# Patient Record
Sex: Female | Born: 1955 | Race: White | Hispanic: No | Marital: Married | Smoking: Never smoker
Health system: Southern US, Community
[De-identification: ages and names within clinical notes are randomized; demographics above are authoritative.]

## PROBLEM LIST (undated history)

## (undated) DIAGNOSIS — M1990 Unspecified osteoarthritis, unspecified site: Secondary | ICD-10-CM

## (undated) DIAGNOSIS — I10 Essential (primary) hypertension: Secondary | ICD-10-CM

## (undated) DIAGNOSIS — R7303 Prediabetes: Secondary | ICD-10-CM

## (undated) DIAGNOSIS — R112 Nausea with vomiting, unspecified: Secondary | ICD-10-CM

## (undated) DIAGNOSIS — Z9889 Other specified postprocedural states: Secondary | ICD-10-CM

## (undated) DIAGNOSIS — Z87442 Personal history of urinary calculi: Secondary | ICD-10-CM

## (undated) DIAGNOSIS — J189 Pneumonia, unspecified organism: Secondary | ICD-10-CM

## (undated) DIAGNOSIS — K219 Gastro-esophageal reflux disease without esophagitis: Secondary | ICD-10-CM

## (undated) DIAGNOSIS — E049 Nontoxic goiter, unspecified: Secondary | ICD-10-CM

## (undated) HISTORY — PX: OVARIAN CYST REMOVAL: SHX89

## (undated) HISTORY — PX: LITHOTRIPSY: SUR834

## (undated) HISTORY — PX: CHOLECYSTECTOMY: SHX55

## (undated) HISTORY — PX: TUBAL LIGATION: SHX77

## (undated) HISTORY — PX: COLONOSCOPY: SHX174

---

## 2016-12-12 ENCOUNTER — Other Ambulatory Visit (HOSPITAL_COMMUNITY): Payer: Self-pay | Admitting: Orthopedic Surgery

## 2016-12-12 DIAGNOSIS — M5416 Radiculopathy, lumbar region: Secondary | ICD-10-CM

## 2016-12-24 ENCOUNTER — Ambulatory Visit (HOSPITAL_COMMUNITY)
Admission: RE | Admit: 2016-12-24 | Discharge: 2016-12-24 | Disposition: A | Discharge status: Home / Self Care | Payer: Worker's Compensation | Source: Ambulatory Visit | Attending: Orthopedic Surgery | Admitting: Orthopedic Surgery

## 2016-12-24 DIAGNOSIS — M2578 Osteophyte, vertebrae: Secondary | ICD-10-CM | POA: Insufficient documentation

## 2016-12-24 DIAGNOSIS — M48061 Spinal stenosis, lumbar region without neurogenic claudication: Secondary | ICD-10-CM | POA: Insufficient documentation

## 2016-12-24 DIAGNOSIS — N200 Calculus of kidney: Secondary | ICD-10-CM | POA: Insufficient documentation

## 2016-12-24 DIAGNOSIS — M5126 Other intervertebral disc displacement, lumbar region: Secondary | ICD-10-CM | POA: Insufficient documentation

## 2016-12-24 DIAGNOSIS — M545 Low back pain: Secondary | ICD-10-CM | POA: Diagnosis present

## 2016-12-24 DIAGNOSIS — M5416 Radiculopathy, lumbar region: Secondary | ICD-10-CM

## 2017-01-25 ENCOUNTER — Encounter (HOSPITAL_COMMUNITY): Payer: Self-pay | Admitting: *Deleted

## 2017-01-25 NOTE — Progress Notes (Signed)
Spoke with pt for pre-op call. Pt denies cardiac history, chest pain or sob. Pt states she is not diabetic. Medication list is incomplete, pt states she's not home and doesn't know dosages. She states she is on an antibiotic now for an UTI, takes Valsartan for her BP and is on an 81 mg Aspirin.

## 2017-01-29 ENCOUNTER — Ambulatory Visit (HOSPITAL_COMMUNITY): Payer: Worker's Compensation | Admitting: Certified Registered Nurse Anesthetist

## 2017-01-29 ENCOUNTER — Encounter (HOSPITAL_COMMUNITY)
Admission: RE | Disposition: A | Discharge status: Home / Self Care | Payer: Self-pay | Source: Ambulatory Visit | Attending: Orthopedic Surgery

## 2017-01-29 ENCOUNTER — Ambulatory Visit (HOSPITAL_COMMUNITY)
Admission: RE | Admit: 2017-01-29 | Discharge: 2017-01-29 | Disposition: A | Discharge status: Home / Self Care | Payer: Worker's Compensation | Source: Ambulatory Visit | Attending: Orthopedic Surgery | Admitting: Orthopedic Surgery

## 2017-01-29 ENCOUNTER — Encounter (HOSPITAL_COMMUNITY): Payer: Self-pay | Admitting: Urology

## 2017-01-29 DIAGNOSIS — I10 Essential (primary) hypertension: Secondary | ICD-10-CM | POA: Insufficient documentation

## 2017-01-29 DIAGNOSIS — M5126 Other intervertebral disc displacement, lumbar region: Secondary | ICD-10-CM | POA: Insufficient documentation

## 2017-01-29 DIAGNOSIS — M5127 Other intervertebral disc displacement, lumbosacral region: Secondary | ICD-10-CM | POA: Diagnosis not present

## 2017-01-29 DIAGNOSIS — M48061 Spinal stenosis, lumbar region without neurogenic claudication: Secondary | ICD-10-CM | POA: Diagnosis not present

## 2017-01-29 DIAGNOSIS — K219 Gastro-esophageal reflux disease without esophagitis: Secondary | ICD-10-CM | POA: Diagnosis not present

## 2017-01-29 DIAGNOSIS — M1990 Unspecified osteoarthritis, unspecified site: Secondary | ICD-10-CM | POA: Insufficient documentation

## 2017-01-29 DIAGNOSIS — Z79899 Other long term (current) drug therapy: Secondary | ICD-10-CM | POA: Insufficient documentation

## 2017-01-29 DIAGNOSIS — Z6836 Body mass index (BMI) 36.0-36.9, adult: Secondary | ICD-10-CM | POA: Diagnosis not present

## 2017-01-29 DIAGNOSIS — N200 Calculus of kidney: Secondary | ICD-10-CM | POA: Insufficient documentation

## 2017-01-29 DIAGNOSIS — Z7982 Long term (current) use of aspirin: Secondary | ICD-10-CM | POA: Insufficient documentation

## 2017-01-29 DIAGNOSIS — M545 Low back pain: Secondary | ICD-10-CM | POA: Diagnosis present

## 2017-01-29 DIAGNOSIS — M5416 Radiculopathy, lumbar region: Secondary | ICD-10-CM

## 2017-01-29 DIAGNOSIS — M4326 Fusion of spine, lumbar region: Secondary | ICD-10-CM | POA: Diagnosis not present

## 2017-01-29 HISTORY — DX: Essential (primary) hypertension: I10

## 2017-01-29 HISTORY — DX: Gastro-esophageal reflux disease without esophagitis: K21.9

## 2017-01-29 HISTORY — DX: Other specified postprocedural states: R11.2

## 2017-01-29 HISTORY — DX: Personal history of urinary calculi: Z87.442

## 2017-01-29 HISTORY — DX: Unspecified osteoarthritis, unspecified site: M19.90

## 2017-01-29 HISTORY — DX: Pneumonia, unspecified organism: J18.9

## 2017-01-29 HISTORY — PX: RADIOLOGY WITH ANESTHESIA: SHX6223

## 2017-01-29 HISTORY — DX: Nontoxic goiter, unspecified: E04.9

## 2017-01-29 HISTORY — DX: Other specified postprocedural states: Z98.890

## 2017-01-29 LAB — BASIC METABOLIC PANEL
ANION GAP: 8 (ref 5–15)
BUN: 19 mg/dL (ref 6–20)
CO2: 23 mmol/L (ref 22–32)
Calcium: 9 mg/dL (ref 8.9–10.3)
Chloride: 111 mmol/L (ref 101–111)
Creatinine, Ser: 1.09 mg/dL — ABNORMAL HIGH (ref 0.44–1.00)
GFR calc Af Amer: >60 mL/min (ref >60)
GFR, EST NON AFRICAN AMERICAN: 54 mL/min — AB (ref >60)
Glucose, Bld: 118 mg/dL — ABNORMAL HIGH (ref 65–99)
POTASSIUM: 3.8 mmol/L (ref 3.5–5.1)
SODIUM: 142 mmol/L (ref 135–145)

## 2017-01-29 SURGERY — RADIOLOGY WITH ANESTHESIA
Anesthesia: General

## 2017-01-29 MED ORDER — GADOBENATE DIMEGLUMINE 529 MG/ML IV SOLN
20.0000 mL | Freq: Once | INTRAVENOUS | Status: AC
  Administered 2017-01-29: 17 mL via INTRAVENOUS

## 2017-01-29 MED ORDER — MIDAZOLAM HCL 2 MG/2ML IJ SOLN
INTRAMUSCULAR | Status: DC | PRN
  Administered 2017-01-29: 2 mg via INTRAVENOUS

## 2017-01-29 MED ORDER — FENTANYL CITRATE (PF) 100 MCG/2ML IJ SOLN
INTRAMUSCULAR | Status: DC | PRN
  Administered 2017-01-29: 100 ug via INTRAVENOUS

## 2017-01-29 MED ORDER — LACTATED RINGERS IV SOLN
INTRAVENOUS | Status: DC
  Administered 2017-01-29: 07:00:00 via INTRAVENOUS

## 2017-01-29 NOTE — Transfer of Care (Signed)
Immediate Anesthesia Transfer of Care Note  Patient: Heidi Brown CourseSharon Vaccarella  Procedure(s) Performed: Procedure(s): LUMBER MRI WITH AND WITHOUT CONTRAST (N/A)  Patient Location: PACU  Anesthesia Type:General  Level of Consciousness: awake, alert , oriented and patient cooperative  Airway & Oxygen Therapy: Patient Spontanous Breathing and Patient connected to nasal cannula oxygen  Post-op Assessment: Report given to RN, Post -op Vital signs reviewed and stable and Patient moving all extremities X 4  Post vital signs: Reviewed and stable  Last Vitals:  Vitals:   01/29/17 0950 01/29/17 0955  BP: (!) 156/89 (!) 145/79  Pulse: 79 77  Resp: (!) 6 (!) 0  Temp:    SpO2: 94% 91%    Last Pain:  Vitals:   01/29/17 0707  TempSrc:   PainSc: 6       Patients Stated Pain Goal: 6 (01/29/17 0707)  Complications: No apparent anesthesia complications

## 2017-01-29 NOTE — Anesthesia Preprocedure Evaluation (Signed)
Anesthesia Evaluation  Patient identified by MRN, date of birth, ID band Patient awake    Reviewed: Allergy & Precautions, NPO status , Patient's Chart, lab work & pertinent test results  History of Anesthesia Complications (+) PONV and history of anesthetic complications  Airway Mallampati: II  TM Distance: >3 FB Neck ROM: Full    Dental  (+) Dental Advisory Given   Pulmonary neg pulmonary ROS,    breath sounds clear to auscultation       Cardiovascular hypertension, Pt. on medications  Rhythm:Regular     Neuro/Psych  Neuromuscular disease negative psych ROS   GI/Hepatic Neg liver ROS, GERD  Controlled,  Endo/Other  Morbid obesity  Renal/GU negative Renal ROS     Musculoskeletal  (+) Arthritis ,   Abdominal   Peds  Hematology negative hematology ROS (+)   Anesthesia Other Findings   Reproductive/Obstetrics                             Anesthesia Physical Anesthesia Plan  ASA: II  Anesthesia Plan: General   Post-op Pain Management:    Induction: Intravenous  PONV Risk Score and Plan: 4 or greater and Ondansetron and Dexamethasone  Airway Management Planned: Oral ETT  Additional Equipment: None  Intra-op Plan:   Post-operative Plan: Extubation in OR  Informed Consent: I have reviewed the patients History and Physical, chart, labs and discussed the procedure including the risks, benefits and alternatives for the proposed anesthesia with the patient or authorized representative who has indicated his/her understanding and acceptance.   Dental advisory given  Plan Discussed with: CRNA and Surgeon  Anesthesia Plan Comments:         Anesthesia Quick Evaluation

## 2017-01-29 NOTE — Anesthesia Postprocedure Evaluation (Signed)
Anesthesia Post Note  Patient: Philomena CourseSharon Duer  Procedure(s) Performed: Procedure(s) (LRB): LUMBER MRI WITH AND WITHOUT CONTRAST (N/A)     Patient location during evaluation: PACU Anesthesia Type: General Level of consciousness: awake and alert Pain management: pain level controlled Vital Signs Assessment: post-procedure vital signs reviewed and stable Respiratory status: spontaneous breathing, nonlabored ventilation, respiratory function stable and patient connected to nasal cannula oxygen Cardiovascular status: blood pressure returned to baseline and stable Postop Assessment: no signs of nausea or vomiting Anesthetic complications: no    Last Vitals:  Vitals:   01/29/17 0955 01/29/17 1006  BP: (!) 145/79 (!) 160/84  Pulse: 77 73  Resp: 16 14  Temp:  37.1 C  SpO2: 91% 94%    Last Pain:  Vitals:   01/29/17 0707  TempSrc:   PainSc: 6                  Maryalyce Sanjuan

## 2017-01-30 ENCOUNTER — Encounter (HOSPITAL_COMMUNITY): Payer: Self-pay | Admitting: Radiology

## 2017-02-04 MED FILL — Dexamethasone Sodium Phosphate Inj 10 MG/ML: INTRAMUSCULAR | Qty: 1 | Status: AC

## 2017-02-04 MED FILL — Rocuronium Bromide IV Soln 50 MG/5ML (10 MG/ML): INTRAVENOUS | Qty: 5 | Status: AC

## 2017-02-04 MED FILL — Midazolam HCl Inj 2 MG/2ML (Base Equivalent): INTRAMUSCULAR | Qty: 2 | Status: AC

## 2017-02-04 MED FILL — Propofol IV Emul 200 MG/20ML (10 MG/ML): INTRAVENOUS | Qty: 20 | Status: AC

## 2017-02-04 MED FILL — Sugammadex Sodium IV 200 MG/2ML (Base Equivalent): INTRAVENOUS | Qty: 2 | Status: AC

## 2017-02-04 MED FILL — Ondansetron HCl Inj 4 MG/2ML (2 MG/ML): INTRAMUSCULAR | Qty: 2 | Status: AC

## 2017-02-04 MED FILL — Lactated Ringer's Solution: INTRAVENOUS | Qty: 1000 | Status: AC

## 2017-02-04 MED FILL — Fentanyl Citrate Preservative Free (PF) Inj 100 MCG/2ML: INTRAMUSCULAR | Qty: 2 | Status: AC

## 2017-02-04 MED FILL — Phenylephrine-NaCl IV Solution 10 MG/250ML-0.9%: INTRAVENOUS | Qty: 250 | Status: AC

## 2018-01-30 ENCOUNTER — Ambulatory Visit: Payer: Self-pay | Admitting: Orthopedic Surgery

## 2018-02-04 ENCOUNTER — Ambulatory Visit: Payer: Self-pay | Admitting: Orthopedic Surgery

## 2018-02-04 NOTE — H&P (View-Only) (Signed)
Heidi Brown is an 61 y.o. female.   Chief Complaint: back and leg pain HPI: Reason for Visit: follow up IME Context: work injury (DOI 07/01/2016); The patient is 1 year, 2 1/2 months out from the injury Location (Lower Extremity): lower back pain ; leg pain bilateral, , (comes and goes) Severity: pain level 5/10 Aggravating Factors: standing or sitting for extended periods of time Alleviating Factors: changing positions Are you working? The patient is currently working full duties. Medications: The patient is currently taking ibuprofen and Tylenol prn Patient is better when to set worse when she stands. She has had an independent medical exam by Dr. Cohen. I have reviewed that.  Past Medical History:  Diagnosis Date  . Arthritis   . GERD (gastroesophageal reflux disease)   . Goiter   . History of kidney stones   . Hypertension   . Pneumonia    walking pneumonia  . PONV (postoperative nausea and vomiting)     Past Surgical History:  Procedure Laterality Date  . CESAREAN SECTION    . CHOLECYSTECTOMY    . COLONOSCOPY    . LITHOTRIPSY Bilateral   . OVARIAN CYST REMOVAL    . OVARIAN CYST REMOVAL    . RADIOLOGY WITH ANESTHESIA N/A 01/29/2017   Procedure: LUMBER MRI WITH AND WITHOUT CONTRAST;  Surgeon: Radiologist, Medication, MD;  Location: [facility] OR;  Service: Radiology;  Laterality: N/A;  . TUBAL LIGATION      Family History  Problem Relation Age of Onset  . Alzheimer's disease Mother   . Heart disease Mother   . Heart disease Father   . Cancer Father   . Hypertension Father   . COPD Father   . Sleep apnea Father    Social History:  reports that she has never smoked. She has never used smokeless tobacco. She reports that she drinks alcohol. She reports that she does not use drugs.  Allergies:  Allergies  Allergen Reactions  . Erythromycin Itching, Rash and Other (See Comments)    Hives, itchy  Clindamycin  Medications: ibuprofen Tylenol valsartan 320  mg-hydrochlorothiazide 25 mg tablet  Review of Systems  Constitutional: Negative.   HENT: Negative.   Eyes: Negative.   Respiratory: Negative.   Cardiovascular: Negative.   Gastrointestinal: Negative.   Genitourinary: Negative.   Musculoskeletal: Positive for back pain.  Skin: Negative.   Neurological: Positive for sensory change and focal weakness.  Psychiatric/Behavioral: Negative.     There were no vitals taken for this visit. Physical Exam  Constitutional: She is oriented to person, place, and time. She appears well-developed and well-nourished. She appears distressed.  HENT:  Head: Normocephalic.  Eyes: Pupils are equal, round, and reactive to light.  Neck: Normal range of motion. Neck supple.  Cardiovascular: Normal rate.  Respiratory: Effort normal.  GI: Soft.  Musculoskeletal:  Patient is a 61-year-old female.  Constitutional: General Appearance: healthy-appearing and distress (mild).  Psychiatric: Mood and Affect: active and alert and normal affect.  Cardiovascular System: Edema Right: none; Dorsalis and posterior tibial pulses 2+. Edema Left: none.  Cervical Spine: Inspection: alignment normal and no muscle atrophy. Bony Palpation: no tenderness of the spinous process. Active Range of Motion: pain elicited by motion.  Motor Strength: Neck Strength (Intrinsics): extension 5/5. C5 on the Right: abduction deltoid 5/5. C5 on the Left: abduction deltoid 5/5. C6 on the Right: flexion biceps 5/5. C6 on the Left: flexion biceps 5/5. C7 on the Right: extension triceps 5/5 and flexion wrist 5/5. C7 on the Left:   extension triceps 5/5 and flexion wrist 5/5. C8 on the Right: flexion fingers 5/5. C8 on the Left: flexion fingers 5/5. T1 on the Right: abduction fingers 5/5. T1 on the Left: abduction fingers 5/5.  Neurological System: Biceps Reflex Right: normal (2). Biceps Reflex Left: normal on the left (2). Brachioradialis Reflex Right: normal (2). Brachioradialis Reflex Left:  normal (2). Triceps Reflex Right: normal (2). Triceps Reflex Left: normal (2). Sensation on the Right: normal median nerve distribution and ulnar nerve distribution and C5 normal, C6 normal, C7 normal, C8 normal, T1 normal, and sensation of the distal extremities normal. Sensation on the Left: normal median nerve distribution and ulnar nerve distribution and C5 normal, C6 normal, C7 normal, T1 normal, and distal extremities normal. Special Tests on the Right: Spurling's test negative. Special Tests on the Left: Spurling's test negative. Special Tests: Hoffman Sign negative. No Babinski or Clonus. Knee Reflex Right: normal (2). Knee Reflex Left: normal (2). Ankle Reflex Right: normal (2). Ankle Reflex Left: normal (2). Babinski Reflex Right: plantar reflex absent. Babinski Reflex Left: plantar reflex absent. Special Tests on the Right: no clonus of the ankle/knee and seated straight leg raising test positive. Special Tests on the Left: no clonus of the ankle/knee and seated straight leg raising test positive.  Skin: Head and Neck: normal. Right Upper Extremity: normal. Left Upper Extremity: normal. Inspection and palpation: no rash.  Gait and Station: Appearance: ambulating with no assistive devices and antalgic gait.  Abdomen: Inspection and Palpation: non-distended and no tenderness.  Lumbar Spine: Inspection: normal alignment. Bony Palpation of the Lumbar Spine: tender at lumbosacral junction.. Bony Palpation of the Right Hip: no tenderness of the greater trochanter and tenderness of the SI joint; Pelvis stable. Bony Palpation of the Left Hip: no tenderness of the greater trochanter and tenderness of the SI joint. Soft Tissue Palpation on the Right: No flank pain with percussion. Active Range of Motion: limited flexion and extention.  Motor Strength: L1 Motor Strength on the Right: hip flexion iliopsoas 5/5. L1 Motor Strength on the Left: hip flexion iliopsoas 5/5. L2-L4 Motor Strength on the Right: knee  extension quadriceps 5/5. L2-L4 Motor Strength on the Left: knee extension quadriceps 5/5. L5 Motor Strength on the Right: ankle dorsiflexion tibialis anterior 5/5 and great toe extension extensor hallucis longus 4/5. L5 Motor Strength on the Left: ankle dorsiflexion tibialis anterior 5/5 and great toe extension extensor hallucis longus 4/5. S1 Motor Strength on the Right: plantar flexion gastrocnemius 5/5. S1 Motor Strength on the Left: plantar flexion gastrocnemius 5/5.  Some discomfort is noted in her cervical spine but neurologically intact in the upper extremities. No Hoffmann sign no Babinski or clonus.  Neurological: She is alert and oriented to person, place, and time.  Skin: Skin is warm and dry.    Review of her x-rays demonstrates autofusion at L4-5. Multilevel anterior osteophytic spurring consistent with skeletal hyperostosis.  MRI demonstrates severe multifactorial spinal stenosis particularly at L4-5 and L3-4 secondary to a ossified disc herniation noted on her CT scan. There is also lateral recess stenosis noted at L5-S1. Autofusion of her facet joints.  Assessment/Plan 1. Pain in lumbar spine M54.5: Low back pain 2. Spinal stenosis of lumbar region M48.061: Spinal stenosis, lumbar region without neurogenic claudication 3. 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 Increased body mass index Z68.41: Body mass index (BMI) 40.0-44.9, adult 6. 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 Patient remained symptomatic following a work-related injury. She had no problems prior to this injury and has had specific ambulatory pain since. That is typical with underlying severe spinal stenosis. She does  have mild of EHL weakness bilaterally. She has to sit for relief.  As previously discussed we discussed living with her symptoms avoiding prolonged standing. Utilization of sitting and leaning forward. As well as periodic at analgesics. I discussed her independent medical exam with Dr. Cohen.  She is continued to indicate that this is no longer acceptable to her. Her husband is present with her as well indicates at home she is in continued pain.  We previously discussed a lumbar decompression I particularly at L4-5. And L3-4.  She does have lateral recess stenosis at L5-S1 to some degree. During the decompression which would require removal of the neural arch of L4 and L5 decompression of L3-4 and L5-S1 would be appropriate.  I had an extensive discussion with the patient concerning the pathology relevant anatomy and treatment options. At this point exhausting conservative treatment and in the presence of a neurologic deficit we discussed microlumbar decompression. I discussed the risks and benefits including bleeding, infection, DVT, PE, anesthetic complications, worsening in their symptoms, improvement in their symptoms, C SF leakage, epidural fibrosis, need for future surgeries such as revision discectomy and lumbar fusion. I also indicated that this is an operation to basically decompress the nerve root to allow recovery as opposed to fixing a herniated disc and that the incidence of recurrent chest disc herniation can approach 15%. Also that nerve root recovery is variable and may not recover completely. She has a calcified disc at L3-4. That would likely remain. The decompression will occur posteriorly. She does have an elevated BMI. No need for a concomitant fusion as she is autofused at those levels.  I discussed the operative course including overnight in the hospital. Immediate ambulation. Follow-up in 2 weeks for suture removal. 6 weeks until healing of the herniation followed by 6 weeks of  reconditioning and strengthening of the core musculature. Also discussed the need to employ the concepts of disc pressure management and core motion following the surgery to minimize the risk of recurrent disc herniation. We will obtain preoperative clearance i if necessary and proceed accordingly.  Discussed these  No history of DVT or MRSA exposure. Obtain a preoperative clearance. She is otherwise healthy.  Plan microlumbar decompression L3-4, L4-5, L5-S1  BISSELL, JACLYN M., PA-C for Dr. Beane 02/04/2018, 8:55 AM   

## 2018-02-04 NOTE — H&P (Signed)
Heidi Brown is an 62 y.o. female.   Chief Complaint: back and leg pain HPI: Reason for Visit: follow up IME Context: work injury (DOI 07/01/2016); The patient is 1 year, 2 1/2 months out from the injury Location (Lower Extremity): lower back pain ; leg pain bilateral, , (comes and goes) Severity: pain level 5/10 Aggravating Factors: standing or sitting for extended periods of time Alleviating Factors: changing positions Are you working? The patient is currently working full duties. Medications: The patient is currently taking ibuprofen and Tylenol prn Patient is better when to set worse when she stands. She has had an independent medical exam by Dr. Noel Gerold. I have reviewed that.  Past Medical History:  Diagnosis Date  . Arthritis   . GERD (gastroesophageal reflux disease)   . Goiter   . History of kidney stones   . Hypertension   . Pneumonia    walking pneumonia  . PONV (postoperative nausea and vomiting)     Past Surgical History:  Procedure Laterality Date  . CESAREAN SECTION    . CHOLECYSTECTOMY    . COLONOSCOPY    . LITHOTRIPSY Bilateral   . OVARIAN CYST REMOVAL    . OVARIAN CYST REMOVAL    . RADIOLOGY WITH ANESTHESIA N/A 01/29/2017   Procedure: LUMBER MRI WITH AND WITHOUT CONTRAST;  Surgeon: Radiologist, Medication, MD;  Location: [facility] OR;  Service: Radiology;  Laterality: N/A;  . TUBAL LIGATION      Family History  Problem Relation Age of Onset  . Alzheimer's disease Mother   . Heart disease Mother   . Heart disease Father   . Cancer Father   . Hypertension Father   . COPD Father   . Sleep apnea Father    Social History:  reports that she has never smoked. She has never used smokeless tobacco. She reports that she drinks alcohol. She reports that she does not use drugs.  Allergies:  Allergies  Allergen Reactions  . Erythromycin Itching, Rash and Other (See Comments)    Hives, itchy  Clindamycin  Medications: ibuprofen Tylenol valsartan 320  mg-hydrochlorothiazide 25 mg tablet  Review of Systems  Constitutional: Negative.   HENT: Negative.   Eyes: Negative.   Respiratory: Negative.   Cardiovascular: Negative.   Gastrointestinal: Negative.   Genitourinary: Negative.   Musculoskeletal: Positive for back pain.  Skin: Negative.   Neurological: Positive for sensory change and focal weakness.  Psychiatric/Behavioral: Negative.     There were no vitals taken for this visit. Physical Exam  Constitutional: She is oriented to person, place, and time. She appears well-developed and well-nourished. She appears distressed.  HENT:  Head: Normocephalic.  Eyes: Pupils are equal, round, and reactive to light.  Neck: Normal range of motion. Neck supple.  Cardiovascular: Normal rate.  Respiratory: Effort normal.  GI: Soft.  Musculoskeletal:  Patient is a 62 year old female.  Constitutional: General Appearance: healthy-appearing and distress (mild).  Psychiatric: Mood and Affect: active and alert and normal affect.  Cardiovascular System: Edema Right: none; Dorsalis and posterior tibial pulses 2+. Edema Left: none.  Cervical Spine: Inspection: alignment normal and no muscle atrophy. Bony Palpation: no tenderness of the spinous process. Active Range of Motion: pain elicited by motion.  Motor Strength: Neck Strength (Intrinsics): extension 5/5. C5 on the Right: abduction deltoid 5/5. C5 on the Left: abduction deltoid 5/5. C6 on the Right: flexion biceps 5/5. C6 on the Left: flexion biceps 5/5. C7 on the Right: extension triceps 5/5 and flexion wrist 5/5. C7 on the Left:  extension triceps 5/5 and flexion wrist 5/5. C8 on the Right: flexion fingers 5/5. C8 on the Left: flexion fingers 5/5. T1 on the Right: abduction fingers 5/5. T1 on the Left: abduction fingers 5/5.  Neurological System: Biceps Reflex Right: normal (2). Biceps Reflex Left: normal on the left (2). Brachioradialis Reflex Right: normal (2). Brachioradialis Reflex Left:  normal (2). Triceps Reflex Right: normal (2). Triceps Reflex Left: normal (2). Sensation on the Right: normal median nerve distribution and ulnar nerve distribution and C5 normal, C6 normal, C7 normal, C8 normal, T1 normal, and sensation of the distal extremities normal. Sensation on the Left: normal median nerve distribution and ulnar nerve distribution and C5 normal, C6 normal, C7 normal, T1 normal, and distal extremities normal. Special Tests on the Right: Spurling's test negative. Special Tests on the Left: Spurling's test negative. Special Tests: Hoffman Sign negative. No Babinski or Clonus. Knee Reflex Right: normal (2). Knee Reflex Left: normal (2). Ankle Reflex Right: normal (2). Ankle Reflex Left: normal (2). Babinski Reflex Right: plantar reflex absent. Babinski Reflex Left: plantar reflex absent. Special Tests on the Right: no clonus of the ankle/knee and seated straight leg raising test positive. Special Tests on the Left: no clonus of the ankle/knee and seated straight leg raising test positive.  Skin: Head and Neck: normal. Right Upper Extremity: normal. Left Upper Extremity: normal. Inspection and palpation: no rash.  Gait and Station: Appearance: ambulating with no assistive devices and antalgic gait.  Abdomen: Inspection and Palpation: non-distended and no tenderness.  Lumbar Spine: Inspection: normal alignment. Bony Palpation of the Lumbar Spine: tender at lumbosacral junction.. Bony Palpation of the Right Hip: no tenderness of the greater trochanter and tenderness of the SI joint; Pelvis stable. Bony Palpation of the Left Hip: no tenderness of the greater trochanter and tenderness of the SI joint. Soft Tissue Palpation on the Right: No flank pain with percussion. Active Range of Motion: limited flexion and extention.  Motor Strength: L1 Motor Strength on the Right: hip flexion iliopsoas 5/5. L1 Motor Strength on the Left: hip flexion iliopsoas 5/5. L2-L4 Motor Strength on the Right: knee  extension quadriceps 5/5. L2-L4 Motor Strength on the Left: knee extension quadriceps 5/5. L5 Motor Strength on the Right: ankle dorsiflexion tibialis anterior 5/5 and great toe extension extensor hallucis longus 4/5. L5 Motor Strength on the Left: ankle dorsiflexion tibialis anterior 5/5 and great toe extension extensor hallucis longus 4/5. S1 Motor Strength on the Right: plantar flexion gastrocnemius 5/5. S1 Motor Strength on the Left: plantar flexion gastrocnemius 5/5.  Some discomfort is noted in her cervical spine but neurologically intact in the upper extremities. No Hoffmann sign no Babinski or clonus.  Neurological: She is alert and oriented to person, place, and time.  Skin: Skin is warm and dry.    Review of her x-rays demonstrates autofusion at L4-5. Multilevel anterior osteophytic spurring consistent with skeletal hyperostosis.  MRI demonstrates severe multifactorial spinal stenosis particularly at L4-5 and L3-4 secondary to a ossified disc herniation noted on her CT scan. There is also lateral recess stenosis noted at L5-S1. Autofusion of her facet joints.  Assessment/Plan 1. Pain in lumbar spine M54.5: Low back pain 2. Spinal stenosis of lumbar region M48.061: Spinal stenosis, lumbar region without neurogenic claudication 3. 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 Increased body mass index Z68.41: Body mass index (BMI) 40.0-44.9, adult 6. Disseminated idiopathic skeletal hyperostosis M48.10: Ankylosing  hyperostosis [[place]], site unspecified  Patient demonstrates neurogenic claudication secondary to spinal stenosis. Patient remained symptomatic following a work-related injury. She had no problems prior to this injury and has had specific ambulatory pain since. That is typical with underlying severe spinal stenosis. She does  have mild of EHL weakness bilaterally. She has to sit for relief.  As previously discussed we discussed living with her symptoms avoiding prolonged standing. Utilization of sitting and leaning forward. As well as periodic at analgesics. I discussed her independent medical exam with Dr. Noel Gerold.  She is continued to indicate that this is no longer acceptable to her. Her husband is present with her as well indicates at home she is in continued pain.  We previously discussed a lumbar decompression I particularly at L4-5. And L3-4.  She does have lateral recess stenosis at L5-S1 to some degree. During the decompression which would require removal of the neural arch of L4 and L5 decompression of L3-4 and L5-S1 would be appropriate.  I had an extensive discussion with the patient concerning the pathology relevant anatomy and treatment options. At this point exhausting conservative treatment and in the presence of a neurologic deficit we discussed microlumbar decompression. I discussed the risks and benefits including bleeding, infection, DVT, PE, anesthetic complications, worsening in their symptoms, improvement in their symptoms, C SF leakage, epidural fibrosis, need for future surgeries such as revision discectomy and lumbar fusion. I also indicated that this is an operation to basically decompress the nerve root to allow recovery as opposed to fixing a herniated disc and that the incidence of recurrent chest disc herniation can approach 15%. Also that nerve root recovery is variable and may not recover completely. She has a calcified disc at L3-4. That would likely remain. The decompression will occur posteriorly. She does have an elevated BMI. No need for a concomitant fusion as she is autofused at those levels.  I discussed the operative course including overnight in the hospital. Immediate ambulation. Follow-up in 2 weeks for suture removal. 6 weeks until healing of the herniation followed by 6 weeks of  reconditioning and strengthening of the core musculature. Also discussed the need to employ the concepts of disc pressure management and core motion following the surgery to minimize the risk of recurrent disc herniation. We will obtain preoperative clearance i if necessary and proceed accordingly.  Discussed these  No history of DVT or MRSA exposure. Obtain a preoperative clearance. She is otherwise healthy.  Plan microlumbar decompression L3-4, L4-5, L5-S1  BISSELL, Dayna Barker., PA-C for Dr. Shelle Iron 02/04/2018, 8:55 AM

## 2018-02-06 ENCOUNTER — Ambulatory Visit: Payer: Self-pay | Admitting: Orthopedic Surgery

## 2018-02-06 ENCOUNTER — Ambulatory Visit (HOSPITAL_COMMUNITY)
Admission: RE | Admit: 2018-02-06 | Discharge: 2018-02-06 | Disposition: A | Discharge status: Home / Self Care | Payer: Self-pay | Source: Ambulatory Visit | Attending: Orthopedic Surgery | Admitting: Orthopedic Surgery

## 2018-02-06 ENCOUNTER — Encounter (HOSPITAL_COMMUNITY): Payer: Self-pay

## 2018-02-06 ENCOUNTER — Other Ambulatory Visit: Payer: Self-pay

## 2018-02-06 ENCOUNTER — Encounter (HOSPITAL_COMMUNITY)
Admission: RE | Admit: 2018-02-06 | Discharge: 2018-02-06 | Disposition: A | Discharge status: Home / Self Care | Payer: No Typology Code available for payment source | Source: Ambulatory Visit | Attending: Specialist | Admitting: Specialist

## 2018-02-06 DIAGNOSIS — M48061 Spinal stenosis, lumbar region without neurogenic claudication: Secondary | ICD-10-CM

## 2018-02-06 DIAGNOSIS — M5136 Other intervertebral disc degeneration, lumbar region: Secondary | ICD-10-CM | POA: Insufficient documentation

## 2018-02-06 HISTORY — DX: Prediabetes: R73.03

## 2018-02-06 LAB — BASIC METABOLIC PANEL
Anion gap: 10 (ref 5–15)
BUN: 21 mg/dL (ref 8–23)
CHLORIDE: 112 mmol/L — AB (ref 98–111)
CO2: 20 mmol/L — ABNORMAL LOW (ref 22–32)
Calcium: 8.9 mg/dL (ref 8.9–10.3)
Creatinine, Ser: 1.48 mg/dL — ABNORMAL HIGH (ref 0.44–1.00)
GFR calc Af Amer: 43 mL/min — ABNORMAL LOW (ref >60)
GFR calc non Af Amer: 37 mL/min — ABNORMAL LOW (ref >60)
GLUCOSE: 191 mg/dL — AB (ref 70–99)
Potassium: 3.7 mmol/L (ref 3.5–5.1)
Sodium: 142 mmol/L (ref 135–145)

## 2018-02-06 LAB — CBC
HEMATOCRIT: 38.6 % (ref 36.0–46.0)
Hemoglobin: 12.2 g/dL (ref 12.0–15.0)
MCH: 28.3 pg (ref 26.0–34.0)
MCHC: 31.6 g/dL (ref 30.0–36.0)
MCV: 89.6 fL (ref 78.0–100.0)
Platelets: 189 10*3/uL (ref 150–400)
RBC: 4.31 MIL/uL (ref 3.87–5.11)
RDW: 13.8 % (ref 11.5–15.5)
WBC: 8.5 10*3/uL (ref 4.0–10.5)

## 2018-02-06 LAB — SURGICAL PCR SCREEN
MRSA, PCR: NEGATIVE
Staphylococcus aureus: POSITIVE — AB

## 2018-02-06 LAB — GLUCOSE, CAPILLARY: Glucose-Capillary: 198 mg/dL — ABNORMAL HIGH (ref 70–99)

## 2018-02-06 NOTE — Progress Notes (Signed)
Patient called back regarding +surgical PCR. Patient concerned about having staph. Educated patient on staph aureus and mupirocin. Verbalized understanding.

## 2018-02-06 NOTE — Pre-Procedure Instructions (Signed)
Aldean BakerSharon O Scheiber  02/06/2018      [facility] [phone]#[id number] - [place] [address] [place] [place] [address] Phone: [phone] Fax: [phone]    Your procedure is scheduled on February 13, 2018.  [facility] 530 AM.  Call this number if you have problems the morning of surgery:  [phone]   Remember:  Do not eat or drink after midnight.    Take these medicines the morning of surgery with A SIP OF WATER  Tylenol-if needed Carvedilol (coreg)  7 days prior to surgery STOP taking any Aspirin (unless otherwise instructed by your surgeon), Aleve, Naproxen, Ibuprofen, Motrin, Advil, Goody's, BC's, all herbal medications, fish oil, and all vitamins    Do not wear jewelry, make-up or nail polish.  Do not wear lotions, powders, or perfumes, or deodorant.  Do not shave 48 hours prior to surgery.    Do not bring valuables to the hospital.   [facility] is not responsible for any belongings or valuables.  Contacts, dentures or bridgework may not be worn into surgery.  Leave your suitcase in the car.  After surgery it may be brought to your room.  For patients admitted to the hospital, discharge time will be determined by your treatment team.  Patients discharged the day of surgery will not be allowed to drive home.    [place]- Preparing For Surgery  Before surgery, you can play an important role. Because skin is not sterile, your skin needs to be as free of germs as possible. You can reduce the number of germs on your skin by washing with CHG (chlorahexidine gluconate) Soap before surgery.  CHG is an antiseptic cleaner which kills germs and bonds with the skin to continue killing germs even after washing.    Oral Hygiene is also important to reduce your risk of infection.  Remember - BRUSH YOUR TEETH THE MORNING OF SURGERY WITH YOUR REGULAR  TOOTHPASTE  Please do not use if you have an allergy to CHG or antibacterial soaps. If your skin becomes reddened/irritated stop using the CHG.  Do not shave (including legs and underarms) for at least 48 hours prior to first CHG shower. It is OK to shave your face.  Please follow these instructions carefully.   1. Shower the NIGHT BEFORE SURGERY and the MORNING OF SURGERY with CHG.   2. If you chose to wash your hair, wash your hair first as usual with your normal shampoo.  3. After you shampoo, rinse your hair and body thoroughly to remove the shampoo.  4. Use CHG as you would any other liquid soap. You can apply CHG directly to the skin and wash gently with a scrungie or a clean washcloth.   5. Apply the CHG Soap to your body ONLY FROM THE NECK DOWN.  Do not use on open wounds or open sores. Avoid contact with your eyes, ears, mouth and genitals (private parts). Wash Face and genitals (private parts)  with your normal soap.  6. Wash thoroughly, paying special attention to the area where your surgery will be performed.  7. Thoroughly rinse your body with warm water from the neck down.  8. DO NOT shower/wash with your normal soap after using and rinsing off the CHG Soap.  9. Pat yourself dry with a CLEAN TOWEL.  10. Wear CLEAN PAJAMAS to bed the night before surgery,  wear comfortable clothes the morning of surgery  11. Place CLEAN SHEETS on your bed the night of your first shower and DO NOT SLEEP WITH PETS.  Day of Surgery:  Do not apply any deodorants/lotions.  Please wear clean clothes to the hospital/surgery center.   Remember to brush your teeth WITH YOUR REGULAR TOOTHPASTE.   Please read over the following fact sheets that you were given.

## 2018-02-06 NOTE — Progress Notes (Addendum)
[facility]  Cardiologist: pt denies  EKG: obtained today  Stress test: pt denies  ECHO: pt denies  Cardiac Cath: pt denies  Chest x-ray: pt denies past year, no recent respiratory infections complications

## 2018-02-06 NOTE — Progress Notes (Signed)
Surgical PCR +staph aureus. Mupirocin called into [facility] [phone]. Left message on patient's voicemail for her to pick up Mupirocin as soon as possible.

## 2018-02-07 ENCOUNTER — Other Ambulatory Visit (HOSPITAL_COMMUNITY): Payer: Self-pay

## 2018-02-13 ENCOUNTER — Ambulatory Visit (HOSPITAL_COMMUNITY)
Admission: RE | Admit: 2018-02-13 | Discharge: 2018-02-15 | Disposition: A | Discharge status: Home / Self Care | Payer: No Typology Code available for payment source | Source: Ambulatory Visit | Attending: Specialist | Admitting: Specialist

## 2018-02-13 ENCOUNTER — Other Ambulatory Visit: Payer: Self-pay

## 2018-02-13 ENCOUNTER — Ambulatory Visit (HOSPITAL_COMMUNITY): Payer: No Typology Code available for payment source

## 2018-02-13 ENCOUNTER — Ambulatory Visit (HOSPITAL_COMMUNITY)
Admission: RE | Disposition: A | Discharge status: Home / Self Care | Payer: Self-pay | Source: Ambulatory Visit | Attending: Specialist

## 2018-02-13 ENCOUNTER — Encounter (HOSPITAL_COMMUNITY): Payer: Self-pay | Admitting: *Deleted

## 2018-02-13 ENCOUNTER — Ambulatory Visit (HOSPITAL_COMMUNITY): Payer: No Typology Code available for payment source | Admitting: Certified Registered"

## 2018-02-13 DIAGNOSIS — Z23 Encounter for immunization: Secondary | ICD-10-CM | POA: Diagnosis not present

## 2018-02-13 DIAGNOSIS — Z79899 Other long term (current) drug therapy: Secondary | ICD-10-CM | POA: Diagnosis not present

## 2018-02-13 DIAGNOSIS — I10 Essential (primary) hypertension: Secondary | ICD-10-CM | POA: Insufficient documentation

## 2018-02-13 DIAGNOSIS — M8588 Other specified disorders of bone density and structure, other site: Secondary | ICD-10-CM | POA: Diagnosis not present

## 2018-02-13 DIAGNOSIS — M48062 Spinal stenosis, lumbar region with neurogenic claudication: Secondary | ICD-10-CM | POA: Diagnosis not present

## 2018-02-13 DIAGNOSIS — M48061 Spinal stenosis, lumbar region without neurogenic claudication: Secondary | ICD-10-CM

## 2018-02-13 DIAGNOSIS — M5127 Other intervertebral disc displacement, lumbosacral region: Secondary | ICD-10-CM | POA: Diagnosis not present

## 2018-02-13 DIAGNOSIS — Z419 Encounter for procedure for purposes other than remedying health state, unspecified: Secondary | ICD-10-CM

## 2018-02-13 HISTORY — PX: LUMBAR LAMINECTOMY/DECOMPRESSION MICRODISCECTOMY: SHX5026

## 2018-02-13 LAB — GLUCOSE, CAPILLARY: Glucose-Capillary: 116 mg/dL — ABNORMAL HIGH (ref 70–99)

## 2018-02-13 SURGERY — LUMBAR LAMINECTOMY/DECOMPRESSION MICRODISCECTOMY 3 LEVELS
Anesthesia: General | Site: Spine Lumbar

## 2018-02-13 MED ORDER — ROCURONIUM BROMIDE 50 MG/5ML IV SOSY
PREFILLED_SYRINGE | INTRAVENOUS | Status: AC
  Filled 2018-02-13: qty 5

## 2018-02-13 MED ORDER — BUPIVACAINE-EPINEPHRINE 0.5% -1:200000 IJ SOLN
INTRAMUSCULAR | Status: DC | PRN
  Administered 2018-02-13: 18 mL

## 2018-02-13 MED ORDER — CEFAZOLIN SODIUM-DEXTROSE 2-4 GM/100ML-% IV SOLN
2.0000 g | Freq: Two times a day (BID) | INTRAVENOUS | Status: DC
  Administered 2018-02-13 – 2018-02-15 (×4): 2 g via INTRAVENOUS
  Filled 2018-02-13 (×4): qty 100

## 2018-02-13 MED ORDER — ACETAMINOPHEN 650 MG RE SUPP: 650.0000 mg | RECTAL | Status: DC | PRN

## 2018-02-13 MED ORDER — DOCUSATE SODIUM 100 MG PO CAPS
100.0000 mg | ORAL_CAPSULE | Freq: Two times a day (BID) | ORAL | Status: DC
  Administered 2018-02-13 – 2018-02-15 (×4): 100 mg via ORAL
  Filled 2018-02-13 (×4): qty 1

## 2018-02-13 MED ORDER — SUGAMMADEX SODIUM 200 MG/2ML IV SOLN
INTRAVENOUS | Status: DC | PRN
  Administered 2018-02-13: 200 mg via INTRAVENOUS

## 2018-02-13 MED ORDER — ROCURONIUM BROMIDE 100 MG/10ML IV SOLN
INTRAVENOUS | Status: DC | PRN
  Administered 2018-02-13: 10 mg via INTRAVENOUS
  Administered 2018-02-13: 20 mg via INTRAVENOUS
  Administered 2018-02-13: 50 mg via INTRAVENOUS
  Administered 2018-02-13: 20 mg via INTRAVENOUS

## 2018-02-13 MED ORDER — MIDAZOLAM HCL 2 MG/2ML IJ SOLN
INTRAMUSCULAR | Status: AC
  Filled 2018-02-13: qty 2

## 2018-02-13 MED ORDER — ALUM & MAG HYDROXIDE-SIMETH 200-200-20 MG/5ML PO SUSP: 30.0000 mL | Freq: Four times a day (QID) | ORAL | Status: DC | PRN

## 2018-02-13 MED ORDER — DEXAMETHASONE SODIUM PHOSPHATE 10 MG/ML IJ SOLN
INTRAMUSCULAR | Status: DC | PRN
  Administered 2018-02-13: 10 mg via INTRAVENOUS

## 2018-02-13 MED ORDER — FENTANYL CITRATE (PF) 100 MCG/2ML IJ SOLN
25.0000 ug | INTRAMUSCULAR | Status: DC | PRN
  Administered 2018-02-13: 25 ug via INTRAVENOUS

## 2018-02-13 MED ORDER — PROPOFOL 10 MG/ML IV BOLUS
INTRAVENOUS | Status: AC
  Filled 2018-02-13: qty 20

## 2018-02-13 MED ORDER — SCOPOLAMINE 1 MG/3DAYS TD PT72
MEDICATED_PATCH | TRANSDERMAL | Status: AC
  Filled 2018-02-13: qty 1

## 2018-02-13 MED ORDER — LIDOCAINE 2% (20 MG/ML) 5 ML SYRINGE
INTRAMUSCULAR | Status: DC | PRN
  Administered 2018-02-13: 40 mg via INTRAVENOUS

## 2018-02-13 MED ORDER — OXYCODONE HCL 5 MG PO TABS
5.0000 mg | ORAL_TABLET | ORAL | Status: DC | PRN
  Administered 2018-02-14: 5 mg via ORAL
  Filled 2018-02-13 (×3): qty 1

## 2018-02-13 MED ORDER — ONDANSETRON HCL 4 MG PO TABS: 4.0000 mg | ORAL_TABLET | Freq: Four times a day (QID) | ORAL | Status: DC | PRN

## 2018-02-13 MED ORDER — 0.9 % SODIUM CHLORIDE (POUR BTL) OPTIME
TOPICAL | Status: DC | PRN
  Administered 2018-02-13: 1000 mL

## 2018-02-13 MED ORDER — CARVEDILOL 25 MG PO TABS
25.0000 mg | ORAL_TABLET | Freq: Two times a day (BID) | ORAL | Status: DC
  Administered 2018-02-13 – 2018-02-15 (×4): 25 mg via ORAL
  Filled 2018-02-13 (×4): qty 1

## 2018-02-13 MED ORDER — VANCOMYCIN HCL IN DEXTROSE 1-5 GM/200ML-% IV SOLN: 1000.0000 mg | INTRAVENOUS | Status: DC

## 2018-02-13 MED ORDER — RISAQUAD PO CAPS
1.0000 | ORAL_CAPSULE | Freq: Every day | ORAL | Status: DC
  Administered 2018-02-14 – 2018-02-15 (×2): 1 via ORAL
  Filled 2018-02-13 (×3): qty 1

## 2018-02-13 MED ORDER — MAGNESIUM CITRATE PO SOLN: 1.0000 | Freq: Once | ORAL | Status: DC | PRN

## 2018-02-13 MED ORDER — KCL IN DEXTROSE-NACL 20-5-0.45 MEQ/L-%-% IV SOLN
INTRAVENOUS | Status: AC
  Administered 2018-02-13: 16:00:00 via INTRAVENOUS
  Filled 2018-02-13: qty 1000

## 2018-02-13 MED ORDER — DEXAMETHASONE SODIUM PHOSPHATE 10 MG/ML IJ SOLN
INTRAMUSCULAR | Status: AC
  Filled 2018-02-13: qty 1

## 2018-02-13 MED ORDER — MENTHOL 3 MG MT LOZG: 1.0000 | LOZENGE | OROMUCOSAL | Status: DC | PRN

## 2018-02-13 MED ORDER — FENTANYL CITRATE (PF) 250 MCG/5ML IJ SOLN
INTRAMUSCULAR | Status: AC
  Filled 2018-02-13: qty 5

## 2018-02-13 MED ORDER — ONDANSETRON HCL 4 MG/2ML IJ SOLN
INTRAMUSCULAR | Status: DC | PRN
  Administered 2018-02-13 (×2): 4 mg via INTRAVENOUS

## 2018-02-13 MED ORDER — METHOCARBAMOL 1000 MG/10ML IJ SOLN
500.0000 mg | Freq: Four times a day (QID) | INTRAVENOUS | Status: DC | PRN
  Filled 2018-02-13: qty 5

## 2018-02-13 MED ORDER — LIDOCAINE 2% (20 MG/ML) 5 ML SYRINGE
INTRAMUSCULAR | Status: AC
  Filled 2018-02-13: qty 5

## 2018-02-13 MED ORDER — THROMBIN (RECOMBINANT) 20000 UNITS EX SOLR
CUTANEOUS | Status: AC
  Filled 2018-02-13: qty 20000

## 2018-02-13 MED ORDER — OXYCODONE HCL 5 MG PO TABS
10.0000 mg | ORAL_TABLET | ORAL | Status: DC | PRN
  Administered 2018-02-13 – 2018-02-15 (×4): 10 mg via ORAL
  Filled 2018-02-13 (×4): qty 2

## 2018-02-13 MED ORDER — OXYCODONE HCL 5 MG/5ML PO SOLN: 5.0000 mg | Freq: Once | ORAL | Status: DC | PRN

## 2018-02-13 MED ORDER — ACETAMINOPHEN 325 MG PO TABS
650.0000 mg | ORAL_TABLET | ORAL | Status: DC | PRN
  Administered 2018-02-15: 650 mg via ORAL
  Filled 2018-02-13: qty 2

## 2018-02-13 MED ORDER — HYDROMORPHONE HCL 1 MG/ML IJ SOLN: 0.5000 mg | INTRAMUSCULAR | Status: DC | PRN

## 2018-02-13 MED ORDER — ACETAMINOPHEN 10 MG/ML IV SOLN
1000.0000 mg | INTRAVENOUS | Status: AC
  Administered 2018-02-13: 1000 mg via INTRAVENOUS

## 2018-02-13 MED ORDER — MIDAZOLAM HCL 5 MG/5ML IJ SOLN
INTRAMUSCULAR | Status: DC | PRN
  Administered 2018-02-13: 2 mg via INTRAVENOUS

## 2018-02-13 MED ORDER — METHOCARBAMOL 500 MG PO TABS
500.0000 mg | ORAL_TABLET | Freq: Four times a day (QID) | ORAL | Status: DC | PRN
  Administered 2018-02-13 – 2018-02-15 (×2): 500 mg via ORAL
  Filled 2018-02-13 (×2): qty 1

## 2018-02-13 MED ORDER — SCOPOLAMINE 1 MG/3DAYS TD PT72
MEDICATED_PATCH | TRANSDERMAL | Status: DC | PRN
  Administered 2018-02-13: 1 via TRANSDERMAL

## 2018-02-13 MED ORDER — IBUPROFEN 200 MG PO TABS: 600.0000 mg | ORAL_TABLET | Freq: Three times a day (TID) | ORAL | 0 refills | Status: AC | PRN

## 2018-02-13 MED ORDER — SODIUM CHLORIDE 0.9 % IV SOLN
INTRAVENOUS | Status: DC | PRN
  Administered 2018-02-13: 20 ug/min via INTRAVENOUS

## 2018-02-13 MED ORDER — DOCUSATE SODIUM 100 MG PO CAPS: 100.0000 mg | ORAL_CAPSULE | Freq: Two times a day (BID) | ORAL | 1 refills | Status: AC

## 2018-02-13 MED ORDER — LACTATED RINGERS IV SOLN
INTRAVENOUS | Status: DC
  Administered 2018-02-13 (×2): via INTRAVENOUS

## 2018-02-13 MED ORDER — OXYCODONE HCL 5 MG PO TABS: 5.0000 mg | ORAL_TABLET | ORAL | 0 refills | Status: AC | PRN

## 2018-02-13 MED ORDER — PHENOL 1.4 % MT LIQD: 1.0000 | OROMUCOSAL | Status: DC | PRN

## 2018-02-13 MED ORDER — THROMBIN 20000 UNITS EX SOLR
CUTANEOUS | Status: DC | PRN
  Administered 2018-02-13: 08:00:00

## 2018-02-13 MED ORDER — ARTIFICIAL TEARS OPHTHALMIC OINT
TOPICAL_OINTMENT | OPHTHALMIC | Status: DC | PRN
  Administered 2018-02-13: 1 via OPHTHALMIC

## 2018-02-13 MED ORDER — PROPOFOL 10 MG/ML IV BOLUS
INTRAVENOUS | Status: DC | PRN
  Administered 2018-02-13: 150 mg via INTRAVENOUS

## 2018-02-13 MED ORDER — ACETAMINOPHEN 10 MG/ML IV SOLN
INTRAVENOUS | Status: AC
  Filled 2018-02-13: qty 100

## 2018-02-13 MED ORDER — ONDANSETRON HCL 4 MG/2ML IJ SOLN: 4.0000 mg | Freq: Four times a day (QID) | INTRAMUSCULAR | Status: DC | PRN

## 2018-02-13 MED ORDER — BISACODYL 5 MG PO TBEC: 5.0000 mg | DELAYED_RELEASE_TABLET | Freq: Every day | ORAL | Status: DC | PRN

## 2018-02-13 MED ORDER — ONDANSETRON HCL 4 MG/2ML IJ SOLN
INTRAMUSCULAR | Status: AC
  Filled 2018-02-13: qty 2

## 2018-02-13 MED ORDER — CEFAZOLIN SODIUM-DEXTROSE 2-4 GM/100ML-% IV SOLN
INTRAVENOUS | Status: AC
  Filled 2018-02-13: qty 100

## 2018-02-13 MED ORDER — POLYETHYLENE GLYCOL 3350 17 G PO PACK: 17.0000 g | PACK | Freq: Every day | ORAL | 0 refills | Status: AC

## 2018-02-13 MED ORDER — EPHEDRINE SULFATE-NACL 50-0.9 MG/10ML-% IV SOSY
PREFILLED_SYRINGE | INTRAVENOUS | Status: DC | PRN
  Administered 2018-02-13: 5 mg via INTRAVENOUS

## 2018-02-13 MED ORDER — BUPIVACAINE-EPINEPHRINE 0.5% -1:200000 IJ SOLN
INTRAMUSCULAR | Status: AC
Start: 2018-02-13 — End: ?
  Filled 2018-02-13: qty 1

## 2018-02-13 MED ORDER — SODIUM CHLORIDE 0.9 % IV SOLN
INTRAVENOUS | Status: DC | PRN
  Administered 2018-02-13: 500 mL

## 2018-02-13 MED ORDER — ACETAMINOPHEN 500 MG PO TABS
1000.0000 mg | ORAL_TABLET | Freq: Four times a day (QID) | ORAL | Status: AC
  Administered 2018-02-13 (×2): 1000 mg via ORAL
  Filled 2018-02-13 (×2): qty 2

## 2018-02-13 MED ORDER — OXYCODONE HCL 5 MG PO TABS: 5.0000 mg | ORAL_TABLET | Freq: Once | ORAL | Status: DC | PRN

## 2018-02-13 MED ORDER — ARTIFICIAL TEARS OPHTHALMIC OINT
TOPICAL_OINTMENT | OPHTHALMIC | Status: AC
  Filled 2018-02-13: qty 3.5

## 2018-02-13 MED ORDER — DEXMEDETOMIDINE HCL 200 MCG/2ML IV SOLN
INTRAVENOUS | Status: DC | PRN
  Administered 2018-02-13 (×3): 4 ug via INTRAVENOUS

## 2018-02-13 MED ORDER — POLYETHYLENE GLYCOL 3350 17 G PO PACK: 17.0000 g | PACK | Freq: Every day | ORAL | Status: DC | PRN

## 2018-02-13 MED ORDER — INFLUENZA VAC SPLIT QUAD 0.5 ML IM SUSY
0.5000 mL | PREFILLED_SYRINGE | INTRAMUSCULAR | Status: AC
  Administered 2018-02-14: 0.5 mL via INTRAMUSCULAR
  Filled 2018-02-13: qty 0.5

## 2018-02-13 MED ORDER — FENTANYL CITRATE (PF) 100 MCG/2ML IJ SOLN
INTRAMUSCULAR | Status: DC | PRN
  Administered 2018-02-13: 100 ug via INTRAVENOUS
  Administered 2018-02-13 (×2): 50 ug via INTRAVENOUS
  Administered 2018-02-13: 100 ug via INTRAVENOUS

## 2018-02-13 MED ORDER — METHOCARBAMOL 500 MG PO TABS: 500.0000 mg | ORAL_TABLET | Freq: Four times a day (QID) | ORAL | 1 refills | Status: AC | PRN

## 2018-02-13 MED ORDER — CEFAZOLIN SODIUM-DEXTROSE 2-4 GM/100ML-% IV SOLN
2.0000 g | INTRAVENOUS | Status: AC
  Administered 2018-02-13: 2 g via INTRAVENOUS

## 2018-02-13 MED ORDER — FENTANYL CITRATE (PF) 100 MCG/2ML IJ SOLN
INTRAMUSCULAR | Status: AC
  Filled 2018-02-13: qty 2

## 2018-02-13 SURGICAL SUPPLY — 63 items
BAG DECANTER FOR FLEXI CONT (MISCELLANEOUS) ×3 IMPLANT
CLOSURE WOUND 1/2 X4 (GAUZE/BANDAGES/DRESSINGS) ×1
CLOTH 2% CHLOROHEXIDINE 3PK (PERSONAL CARE ITEMS) ×3 IMPLANT
CONT SPEC 4OZ CLIKSEAL STRL BL (MISCELLANEOUS) ×3 IMPLANT
DRAPE HALF SHEET 40X57 (DRAPES) ×3 IMPLANT
DRAPE LAPAROTOMY 100X72X124 (DRAPES) ×3 IMPLANT
DRAPE MICROSCOPE LEICA (MISCELLANEOUS) ×3 IMPLANT
DRAPE SHEET LG 3/4 BI-LAMINATE (DRAPES) ×3 IMPLANT
DRAPE SURG 17X11 SM STRL (DRAPES) ×3 IMPLANT
DRAPE UTILITY XL STRL (DRAPES) ×6 IMPLANT
DRSG AQUACEL AG ADV 3.5X 4 (GAUZE/BANDAGES/DRESSINGS) IMPLANT
DRSG AQUACEL AG ADV 3.5X 6 (GAUZE/BANDAGES/DRESSINGS) IMPLANT
DRSG TELFA 3X8 NADH (GAUZE/BANDAGES/DRESSINGS) IMPLANT
DURAPREP 26ML APPLICATOR (WOUND CARE) ×3 IMPLANT
DURASEAL SPINE SEALANT 3ML (MISCELLANEOUS) IMPLANT
ELECT BLADE 4.0 EZ CLEAN MEGAD (MISCELLANEOUS) ×3
ELECT REM PT RETURN 9FT ADLT (ELECTROSURGICAL) ×3
ELECTRODE BLDE 4.0 EZ CLN MEGD (MISCELLANEOUS) ×1 IMPLANT
ELECTRODE REM PT RTRN 9FT ADLT (ELECTROSURGICAL) ×1 IMPLANT
EVACUATOR 1/8 PVC DRAIN (DRAIN) ×3 IMPLANT
GAUZE SPONGE 4X4 12PLY STRL (GAUZE/BANDAGES/DRESSINGS) ×3 IMPLANT
GLOVE BIO SURGEON STRL SZ 6.5 (GLOVE) ×8 IMPLANT
GLOVE BIO SURGEONS STRL SZ 6.5 (GLOVE) ×4
GLOVE BIOGEL PI IND STRL 6.5 (GLOVE) ×3 IMPLANT
GLOVE BIOGEL PI IND STRL 7.0 (GLOVE) ×1 IMPLANT
GLOVE BIOGEL PI IND STRL 8 (GLOVE) ×1 IMPLANT
GLOVE BIOGEL PI INDICATOR 6.5 (GLOVE) ×6
GLOVE BIOGEL PI INDICATOR 7.0 (GLOVE) ×2
GLOVE BIOGEL PI INDICATOR 8 (GLOVE) ×2
GLOVE SURG SS PI 7.5 STRL IVOR (GLOVE) ×6 IMPLANT
GLOVE SURG SS PI 8.0 STRL IVOR (GLOVE) ×6 IMPLANT
GOWN STRL REUS W/ TWL LRG LVL3 (GOWN DISPOSABLE) ×3 IMPLANT
GOWN STRL REUS W/ TWL XL LVL3 (GOWN DISPOSABLE) ×1 IMPLANT
GOWN STRL REUS W/TWL LRG LVL3 (GOWN DISPOSABLE) ×6
GOWN STRL REUS W/TWL XL LVL3 (GOWN DISPOSABLE) ×2
IV CATH 14GX2 1/4 (CATHETERS) ×3 IMPLANT
KIT BASIN OR (CUSTOM PROCEDURE TRAY) ×3 IMPLANT
KIT POSITION SURG JACKSON T1 (MISCELLANEOUS) ×3 IMPLANT
NEEDLE 22X1 1/2 (OR ONLY) (NEEDLE) ×3 IMPLANT
NEEDLE SPNL 18GX3.5 QUINCKE PK (NEEDLE) ×6 IMPLANT
PACK LAMINECTOMY NEURO (CUSTOM PROCEDURE TRAY) ×3 IMPLANT
PAD ABD 8X10 STRL (GAUZE/BANDAGES/DRESSINGS) ×3 IMPLANT
PATTIES SURGICAL .5 X.5 (GAUZE/BANDAGES/DRESSINGS) ×3 IMPLANT
PATTIES SURGICAL .75X.75 (GAUZE/BANDAGES/DRESSINGS) ×3 IMPLANT
RUBBERBAND STERILE (MISCELLANEOUS) ×6 IMPLANT
SPONGE LAP 4X18 RFD (DISPOSABLE) ×6 IMPLANT
SPONGE SURGIFOAM ABS GEL 100 (HEMOSTASIS) ×3 IMPLANT
STAPLER VISISTAT (STAPLE) ×3 IMPLANT
STRIP CLOSURE SKIN 1/2X4 (GAUZE/BANDAGES/DRESSINGS) ×2 IMPLANT
SUT NURALON 4 0 TR CR/8 (SUTURE) IMPLANT
SUT PROLENE 3 0 PS 2 (SUTURE) IMPLANT
SUT VIC AB 0 CT2 27 (SUTURE) ×3 IMPLANT
SUT VIC AB 1 CT1 27 (SUTURE) ×6
SUT VIC AB 1 CT1 27XBRD ANTBC (SUTURE) ×3 IMPLANT
SUT VIC AB 1-0 CT2 27 (SUTURE) IMPLANT
SUT VIC AB 2-0 CT1 27 (SUTURE) ×6
SUT VIC AB 2-0 CT1 TAPERPNT 27 (SUTURE) ×3 IMPLANT
SUT VIC AB 2-0 CT2 27 (SUTURE) IMPLANT
SYR 3ML LL SCALE MARK (SYRINGE) ×3 IMPLANT
TAPE CLOTH SURG 4X10 WHT LF (GAUZE/BANDAGES/DRESSINGS) ×3 IMPLANT
TOWEL GREEN STERILE (TOWEL DISPOSABLE) ×3 IMPLANT
TOWEL GREEN STERILE FF (TOWEL DISPOSABLE) ×3 IMPLANT
YANKAUER SUCT BULB TIP NO VENT (SUCTIONS) ×3 IMPLANT

## 2018-02-13 NOTE — Interval H&P Note (Signed)
History and Physical Interval Note:  02/13/2018 7:17 AM  Heidi Brown  has presented today for surgery, with the diagnosis of Spinal stenosis  The various methods of treatment have been discussed with the patient and family. After consideration of risks, benefits and other options for treatment, the patient has consented to  Procedure(s) with comments: Microlumbar decompression L3-4, L4-5, L5-S1 (N/A) - 3 hrs as a surgical intervention .  The patient's history has been reviewed, patient examined, no change in status, stable for surgery.  I have reviewed the patient's chart and labs.  Questions were answered to the patient's satisfaction.     [name]

## 2018-02-13 NOTE — Anesthesia Procedure Notes (Signed)
Procedure Name: Intubation Date/Time: 02/13/2018 7:39 AM Performed by: Gwyndolyn Saxon, CRNA Pre-anesthesia Checklist: Patient identified, Emergency Drugs available, Suction available and Patient being monitored Patient Re-evaluated:Patient Re-evaluated prior to induction Oxygen Delivery Method: Circle System Utilized Preoxygenation: Pre-oxygenation with 100% oxygen Induction Type: IV induction Ventilation: Mask ventilation without difficulty Laryngoscope Size: Mac and 3 Grade View: Grade II Tube type: Oral Tube size: 7.0 mm Number of attempts: 1 Airway Equipment and Method: Stylet and Oral airway Placement Confirmation: ETT inserted through vocal cords under direct vision,  positive ETCO2 and breath sounds checked- equal and bilateral Secured at: 21 cm Tube secured with: Tape Dental Injury: Teeth and Oropharynx as per pre-operative assessment  Comments: Intubated by [facility]

## 2018-02-13 NOTE — Anesthesia Postprocedure Evaluation (Signed)
Anesthesia Post Note  Patient: Aldean BakerSharon O Elzey  Procedure(s) Performed: Microlumbar decompression Lumbar Three-Lumbar Four, Lumbar Four-Lumbar Five, Lumbar Five-Sacral One (N/A Spine Lumbar)     Patient location during evaluation: PACU Anesthesia Type: General Level of consciousness: awake and alert Pain management: pain level controlled Vital Signs Assessment: post-procedure vital signs reviewed and stable Respiratory status: spontaneous breathing, nonlabored ventilation, respiratory function stable and patient connected to nasal cannula oxygen Cardiovascular status: blood pressure returned to baseline and stable Postop Assessment: no apparent nausea or vomiting Anesthetic complications: no    Last Vitals:  Vitals:   02/13/18 1330 02/13/18 1416  BP: 139/76 (!) 146/81  Pulse:  (!) 58  Resp:  16  Temp:  37 C  SpO2:  98%    Last Pain:  Vitals:   02/13/18 1416  TempSrc: Oral  PainSc: 3                  Daysie Helf S

## 2018-02-13 NOTE — Plan of Care (Signed)
  Problem: Safety: Goal: Ability to remain free from injury will improve Outcome: Progressing   

## 2018-02-13 NOTE — Transfer of Care (Signed)
Immediate Anesthesia Transfer of Care Note  Patient: Heidi Brown  Procedure(s) Performed: Microlumbar decompression Lumbar Three-Lumbar Four, Lumbar Four-Lumbar Five, Lumbar Five-Sacral One (N/A Spine Lumbar)  Patient Location: PACU  Anesthesia Type:General  Level of Consciousness: drowsy  Airway & Oxygen Therapy: Patient Spontanous Breathing and Patient connected to face mask oxygen  Post-op Assessment: Report given to RN and Post -op Vital signs reviewed and stable  Post vital signs: Reviewed and stable  Last Vitals:  Vitals Value Taken Time  BP 135/70 02/13/2018 11:21 AM  Temp    Pulse 57 02/13/2018 11:22 AM  Resp 17 02/13/2018 11:22 AM  SpO2 97 % 02/13/2018 11:22 AM  Vitals shown include unvalidated device data.  Last Pain:  Vitals:   02/13/18 0614  TempSrc: Oral  PainSc:          Complications: No apparent anesthesia complications

## 2018-02-13 NOTE — Brief Op Note (Signed)
02/13/2018  10:54 AM  PATIENT:  Heidi Brown  [age] y.o. female  PRE-OPERATIVE DIAGNOSIS:  Spinal stenosis  POST-OPERATIVE DIAGNOSIS:  Spinal stenosis  PROCEDURE:  Procedure(s) with comments: Microlumbar decompression Lumbar Three-Lumbar Four, Lumbar Four-Lumbar Five, Lumbar Five-Sacral One (N/A) - 3 hrs  SURGEON:  Surgeon(s) and Role:    Jene Every* Kacey Vicuna, MD - Primary  PHYSICIAN ASSISTANT:   ASSISTANTS: Bissell   ANESTHESIA:   general  EBL:  450 mL   BLOOD ADMINISTERED:none  DRAINS: hemovac   LOCAL MEDICATIONS USED:  MARCAINE     SPECIMEN:  No Specimen  DISPOSITION OF SPECIMEN:  N/A  COUNTS:  YES  TOURNIQUET:  * No tourniquets in log *  DICTATION: .Other Dictation: Dictation Number [id number]  PLAN OF CARE: Admit for overnight observation  PATIENT DISPOSITION:  PACU - hemodynamically stable.   Delay start of Pharmacological VTE agent (>24hrs) due to surgical blood loss or risk of bleeding: yes

## 2018-02-13 NOTE — Op Note (Signed)
Heidi Brown: Siegel, Sury O. MEDICAL RECORD ZO:[id number] ACCOUNT [id number] DATE OF BIRTH:January 02, 1956 FACILITY: [facility] LOCATION: [facility] PHYSICIAN:Sheala Dosh Connye Burkitt. Callyn Severtson, MD  OPERATIVE REPORT  DATE OF PROCEDURE:  02/13/2018  PREOPERATIVE DIAGNOSES: 1.  Spinal stenosis L3-L4, L4-L5, L5-S1. 2.  Diffuse idiopathic hyperostosis. 3.  Calcified herniated disk.  POSTOPERATIVE DIAGNOSES:   1.  Spinal stenosis L3-L4, L4-L5, L5-S1. 2.  Diffuse idiopathic hyperostosis. 3.  Calcified herniated disk.  PROCEDURE PERFORMED: 1.  Microlumbar decompression at L3-L4, L4-L5, L5-S1. 2.  Foraminotomies of L3, L4, L5, S1 bilaterally. 3.  Laminectomies of L4, L5.  Bilateral hemilaminotomies of L3. 4.  Removal of posterior spinous process osteophytes.  ANESTHESIA:  General.  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  She had a work injury.  She has underlying diffuse idiopathic skeletal hyperostosis with calcifications of the disk, particularly at  L4-L5 of the posterior elements.  She had an injury at work.  She had a disk calcified with associated spinal stenosis, particularly at L4-L5, but also severe at L3-L4 and at 51.  She was indicated for decompression at those multiple levels, failing  conservative treatment including rest, activity modification, epidural steroid injections, analgesics.  She had a significant decrease in ambulatory range and was unable to stand in one position due to the severe spinal stenosis and bilateral radicular  pain in the predominantly L5 nerve root distribution.  She has an osteophyte at the 3, 4 ossification in her posterior elements and of her disk at 4, 5.  We discussed the risks and benefits including bleeding, infection, damage to neurovascular  structures, no change in symptoms, worsening symptoms, DVT, PE, anesthetic complications, need for fusion, also inability to  perform the decompression due to the ossifications.  DESCRIPTION OF PROCEDURE:  With the patient in supine position after induction of adequate anesthesia, 2 g Kefzol, she was placed prone on the [place] table.  All bony prominences were well padded.  Foley to gravity.  Abdomen free.  Lumbar region was  prepped and draped in the usual sterile fashion.  Two 18-gauge spinal needles were utilized to localize the L3-S1 space.  An incision was made from the spinous process of L3-S1.  Subcutaneous tissue was dissected with electrocautery and achieved  hemostasis.   Marcaine 0.25% with epinephrine was infiltrated in the skin as well as in the paraspinous musculature for anesthesia as well as hemostasis.  The dorsal lumbar fascia was divided in line with skin incision.  Paraspinous muscle elevated from  lamina of L3-L4, L4-L5, L5-S1.  Electrocautery was utilized to achieve strict hemostasis.  Throughout the case, the patient's blood pressure varied, and when elevated, increasing bleeding was noted.  Decreased with a lower blood pressure.  She required a  relatively higher blood pressure.  I then placed a Veterinary surgeonMcCulloch retractor.  A second confirmatory radiograph was obtained.  This necessitated a slight adjustment of the retractors.  First, the spinous process of L5 was removed, partial of S1, L3 and L4.   There was essentially ossification of the 4 to 5 spinous process.  This was then moved down to the lamina.  Noted was a very small interlaminar window at 3, 4 with noted ligamentum and also an open interlaminar window at L5-S1, but no interlaminar window  was noted at L4-L5.  We started decompression first at L5-S1.  I utilized a straight curette to detach ligamentum flavum from the caudad edge of L5,  exposing the L5 lamina.  Neuro patties were placed beneath the ligamentum flavum.  Noted was a dural  rent beneath the ligamentum flavum.  We meticulously with a combination of a micro curette and a 2 mm Kerrison and a  1 mm Kerrison performed bilateral hemilaminotomy of 5 and also removed the ligamentum flavum from the interspace, decompressed the  lateral recess to the medial border of the pedicle, protecting the S1 nerve root performing foraminotomies of L5 and S1.  The patient had a significant lateral recess stenosis at S1.  Bipolar cautery was utilized to achieve hemostasis.  We used bone wax  as well.  Following this, we went cephalad again since no inner laminar window was noted at L4-L5.  We then began a second decompression at L3-L4.  A Leksell rongeur was utilized to remove a portion of the hemilamina of L3 bilaterally.  I then used a  straight curette to define and skeletonize the superior aspect of L4 where there was some shingling and hypertrophic facet nearly closing the interlaminar space on the right.  I performed hemilaminotomies of L3 bilaterally up to the point of detaching  the ligamentum flavum.  A micro curette was utilized to skeletonize the cephalad edge of L4.  Then, with a neuro patty and a Woodson retractor, I meticulously performed bilateral hemilaminotomies of the L4 centrally and laterally.  I was then able to  remove the ligamentum flavum entirely from the interspace and decompress both lateral recesses to the medial border of the pedicle port.  I performed a foraminotomy of L3 bilaterally, and on the right, the patient had a large osseous or a large osseous  ligamentum flavum body.  We meticulously developed a plane between this and the thecal sac and the nerve root was able then to remove it with a 2 mm Kerrison.  Some residual ligamentum flavum adhered to the dura that I felt was not compressive but thus  left due to the adhesion.  We then continued caudad, enlarging the hemilaminotomies of 4, protecting the thecal sac.  I started first with these decompressions laterally and then moved centrally.  I removed a portion of the ossification of the interspace  at L4-L5, and closer to the  lamina and through the cortical bone of the lamina, there was some noted ligamentum then exposed.  I was able therefore to then skeletonize the inferior aspect of L4.  Then detached the ligamentum flavum, and from caudad to  cephalad removed the remainder of the neural arch of L4.  Good restoration of the thecal sac was noted.  I decompressed both lateral recesses, performing foraminotomies of L4.  Ligamentum flavum hypertrophy was noted bilaterally.  Bipolar electrocautery  was utilized to achieve hemostasis.  I then skeletonized the cephalad edge of the remaining arch of L5.  I placed a neuro patty beneath that arch and then completed the removal of the neural arch of L4.  Following this, I was able to mobilize and  decompress both lateral recesses to the medial border of the pedicle of L5 and of L4, performing foraminotomies of L5 and L4 bilaterally.  Bone wax was placed on the cancellous surfaces.  The thecal sac was protected at all times.  I obtained a final  confirmatory radiograph with a Lorette Ang out the foramen of L3 and S1.  In identifying the area where the calcified disk was at the disk space at L4-L5 and just cephalad to that, I explored both sides of the thecal sac with  a Facilities manager.  There was  a smaller mass effect just beneath the thecal sac, but it was a hardened osteophyte or calcified disk.  I was not able to mobilize the thecal sac on either side for direct visualization.  I felt though below this area noted on the MRI and above it the  [facility] retractor passed freely and out the foramen of L4 and L5.  There was good restoration of the thecal sac.  I felt that the posterior decompression would suffice to address the stenosis without addressing that calcified disk.  It did appear since  the MRI was obtained over a year ago that this was diminished in the mass effect.  After this confirmatory radiograph, I placed a neuro probe out the foramen of L3, L4, L5 and S1 bilaterally.  They  were widely patent above the pedicle of L3.  Good  restoration of the thecal sac.  No evidence of CSF leakage.  Bone wax was meticulously placed in all cancellous surfaces.  I placed 4 small patties of thrombin-soaked Gelfoam at L3-L4 and down into L5-S1.  We copiously irrigated with antibiotic  irrigation.  I then removed the [facility] retractors.  I thought I had addressed the pathology appropriately.  I then proceeded to repair the dorsal lumbar fascia with #1 Vicryl interrupted figure-of-eight sutures.  I placed a cautionary Hemovac drain  through a separate stab incision through the fascia and out through the skin.  After closing the dorsal lumbar fascia with #1 Vicryl interrupted figure-of-eight sutures, the Hemovac slid freely.  Subcu with 2-0 and skin with staples.  The wound was  dressed sterilely, placed supine on the hospital bed, extubated without difficulty and transported to the recovery room in satisfactory condition.  The patient tolerated the procedure well.  No complications.  Assistant Andrez Grime, [place].  Blood loss 400 mL.  LN/NUANCE  D:02/13/2018 T:02/13/2018 JOB:[id number]

## 2018-02-13 NOTE — Care Management Note (Signed)
Case Management Note  Patient Details  Name: Aldean BakerSharon O Phagan MRN: [id number] Date of Birth: 10-Sep-1955  Subjective/Objective:     microlumbar decompression of L3-L4, L4-5, L5-S1               Action/Plan: NCM spoke to pt and states her Worker's Comp is handled through her attorney, [employer] in [place], # [phone]. States no DME is needed.    Expected Discharge Date:                 Expected Discharge Plan:  Home/Self Care  In-House Referral:  NA  Discharge planning Services  CM Consult  Post Acute Care Choice:    Choice offered to:  NA  DME Arranged:  N/A DME Agency:  NA  HH Arranged:  NA HH Agency:  NA  Status of Service:  In process, will continue to follow  If discussed at Long Length of Stay Meetings, dates discussed:    Additional Comments:  Elliot CousinShavis, [place]ira Olkowski Ellen, RN 02/13/2018, 4:25 PM

## 2018-02-13 NOTE — Anesthesia Preprocedure Evaluation (Signed)
Anesthesia Evaluation  Patient identified by MRN, date of birth, ID band Patient awake    Reviewed: Allergy & Precautions, H&P , NPO status , Patient's Chart, lab work & pertinent test results  History of Anesthesia Complications (+) PONV and history of anesthetic complications  Airway Mallampati: II   Neck ROM: full    Dental   Pulmonary    breath sounds clear to auscultation       Cardiovascular hypertension,  Rhythm:regular Rate:Normal     Neuro/Psych    GI/Hepatic GERD  ,  Endo/Other    Renal/GU Renal InsufficiencyRenal diseasestones     Musculoskeletal  (+) Arthritis ,   Abdominal   Peds  Hematology   Anesthesia Other Findings   Reproductive/Obstetrics                             Anesthesia Physical Anesthesia Plan  ASA: III  Anesthesia Plan: General   Post-op Pain Management:    Induction: Intravenous  PONV Risk Score and Plan: 4 or greater and Ondansetron, Dexamethasone, Midazolam and Treatment may vary due to age or medical condition  Airway Management Planned: Oral ETT  Additional Equipment:   Intra-op Plan:   Post-operative Plan: Extubation in OR  Informed Consent: I have reviewed the patients History and Physical, chart, labs and discussed the procedure including the risks, benefits and alternatives for the proposed anesthesia with the patient or authorized representative who has indicated his/her understanding and acceptance.     Plan Discussed with: CRNA, Anesthesiologist and Surgeon  Anesthesia Plan Comments:         Anesthesia Quick Evaluation

## 2018-02-13 NOTE — Evaluation (Signed)
Physical Therapy Evaluation Patient Details Name: Heidi Brown MRN: [id number] DOB: [birth year] Today's Date: 02/13/2018   History of Present Illness  Pt is a 62 y/o female L3-S1 microlumbar decompression. PMH includes HTN.   Clinical Impression  Patient is s/p above surgery resulting in the deficits listed below (see PT Problem List). Pt very guarded during ambulation with RW. Required min A for mobility this session. Educated about generalized walking program and back precautions. Patient will benefit from skilled PT to increase their independence and safety with mobility (while adhering to their precautions) to allow discharge to the venue listed below.     Follow Up Recommendations No PT follow up;Supervision for mobility/OOB    Equipment Recommendations  Rolling walker with 5" wheels    Recommendations for Other Services       Precautions / Restrictions Precautions Precautions: Back Precaution Booklet Issued: Yes (comment) Precaution Comments: Reviewed back precautions with pt.  Restrictions Weight Bearing Restrictions: No      Mobility  Bed Mobility Overal bed mobility: Needs Assistance Bed Mobility: Rolling;Sidelying to Sit;Sit to Sidelying Rolling: Min assist Sidelying to sit: Min assist     Sit to sidelying: Min guard General bed mobility comments: Min A for assist with rolling to side and for trunk elevation. Increased time required to perform. Min guard to ensure appropriate technique when returnind to sidelying.   Transfers Overall transfer level: Needs assistance Equipment used: Rolling walker (2 wheeled) Transfers: Sit to/from Stand Sit to Stand: Min assist         General transfer comment: Min A for steadying assist. Verbal cues for safe hand placement.   Ambulation/Gait Ambulation/Gait assistance: Min assist Gait Distance (Feet): 50 Feet Assistive device: Rolling walker (2 wheeled) Gait Pattern/deviations: Step-through pattern;Decreased stride  length Gait velocity: Decreased    General Gait Details: Slow, very guarded gait. Min A for steadying assist. Educated about generalized walking program to perform at home.   Stairs            Wheelchair Mobility    Modified Rankin (Stroke Patients Only)       Balance Overall balance assessment: Needs assistance Sitting-balance support: No upper extremity supported;Feet supported Sitting balance-Leahy Scale: Fair     Standing balance support: Bilateral upper extremity supported;During functional activity Standing balance-Leahy Scale: Poor Standing balance comment: Reliant on BUE support.                              Pertinent Vitals/Pain Pain Assessment: 0-10 Pain Score: 6  Pain Location: back  Pain Descriptors / Indicators: Aching;Operative site guarding Pain Intervention(s): Limited activity within patient's tolerance;Monitored during session;Repositioned    Home Living Family/patient expects to be discharged to:: Private residence Living Arrangements: Spouse/significant other;Children;Other relatives Available Help at Discharge: Family;Available 24 hours/day Type of Home: House Home Access: Stairs to enter Entrance Stairs-Rails: None Entrance Stairs-Number of Steps: 3 Home Layout: Two level Home Equipment: None      Prior Function Level of Independence: Independent         Comments: Still working, driving, Cytogeneticist        Extremity/Trunk Assessment   Upper Extremity Assessment Upper Extremity Assessment: Defer to OT evaluation    Lower Extremity Assessment Lower Extremity Assessment: Generalized weakness(reports pain is improved )    Cervical / Trunk Assessment Cervical / Trunk Assessment: Other exceptions Cervical / Trunk Exceptions: s/p lumbar surgery   Communication  Communication: No difficulties  Cognition Arousal/Alertness: Awake/alert Behavior During Therapy: WFL for tasks assessed/performed Overall  Cognitive Status: Within Functional Limits for tasks assessed                                        General Comments General comments (skin integrity, edema, etc.): Pt's husband present during session.     Exercises     Assessment/Plan    PT Assessment Patient needs continued PT services  PT Problem List Decreased strength;Decreased balance;Decreased activity tolerance;Decreased mobility;Decreased knowledge of use of DME;Decreased knowledge of precautions;Pain       PT Treatment Interventions DME instruction;Gait training;Functional mobility training;Stair training;Therapeutic activities;Balance training;Therapeutic exercise;Patient/family education    PT Goals (Current goals can be found in the Care Plan section)  Acute Rehab PT Goals Patient Stated Goal: to go  home tomorrow  PT Goal Formulation: With patient/family Time For Goal Achievement: 02/27/18 Potential to Achieve Goals: Good    Frequency Min 5X/week   Barriers to discharge        Co-evaluation               AM-PAC PT "6 Clicks" Daily Activity  Outcome Measure Difficulty turning over in bed (including adjusting bedclothes, sheets and blankets)?: Unable Difficulty moving from lying on back to sitting on the side of the bed? : Unable Difficulty sitting down on and standing up from a chair with arms (e.g., wheelchair, bedside commode, etc,.)?: Unable Help needed moving to and from a bed to chair (including a wheelchair)?: A Little Help needed walking in hospital room?: A Little Help needed climbing 3-5 steps with a railing? : A Lot 6 Click Score: 11    End of Session Equipment Utilized During Treatment: Gait belt Activity Tolerance: Patient limited by pain Patient left: in bed;with call bell/phone within reach;with family/visitor present Nurse Communication: Mobility status;Other (comment)(to ambulate with pt at least 1 more time ) PT Visit Diagnosis: Other abnormalities of gait and  mobility (R26.89);Pain Pain - part of body: (back )    Time: 1727-1800 PT Time Calculation (min) (ACUTE ONLY): 33 min   Charges:   PT Evaluation $PT Eval Low Complexity: 1 Low PT Treatments $Gait Training: 8-22 mins        Gladys DammeBrittany Neenah Canter, PT, DPT  Acute Rehabilitation Services  Pager: [phone] Office: [phone]   Lehman PromBrittany S Shauntae Reitman 02/13/2018, 6:38 PM

## 2018-02-14 ENCOUNTER — Encounter (HOSPITAL_COMMUNITY): Payer: Self-pay | Admitting: Specialist

## 2018-02-14 DIAGNOSIS — M48062 Spinal stenosis, lumbar region with neurogenic claudication: Secondary | ICD-10-CM | POA: Diagnosis not present

## 2018-02-14 LAB — BASIC METABOLIC PANEL
Anion gap: 10 (ref 5–15)
BUN: 18 mg/dL (ref 8–23)
CHLORIDE: 107 mmol/L (ref 98–111)
CO2: 22 mmol/L (ref 22–32)
CREATININE: 1.02 mg/dL — AB (ref 0.44–1.00)
Calcium: 8.6 mg/dL — ABNORMAL LOW (ref 8.9–10.3)
GFR calc non Af Amer: 58 mL/min — ABNORMAL LOW (ref >60)
Glucose, Bld: 146 mg/dL — ABNORMAL HIGH (ref 70–99)
POTASSIUM: 4.5 mmol/L (ref 3.5–5.1)
Sodium: 139 mmol/L (ref 135–145)

## 2018-02-14 LAB — CBC
HEMATOCRIT: 35.8 % — AB (ref 36.0–46.0)
HEMOGLOBIN: 11.4 g/dL — AB (ref 12.0–15.0)
MCH: 27.9 pg (ref 26.0–34.0)
MCHC: 31.8 g/dL (ref 30.0–36.0)
MCV: 87.7 fL (ref 78.0–100.0)
Platelets: 196 10*3/uL (ref 150–400)
RBC: 4.08 MIL/uL (ref 3.87–5.11)
RDW: 13.8 % (ref 11.5–15.5)
WBC: 11.1 10*3/uL — ABNORMAL HIGH (ref 4.0–10.5)

## 2018-02-14 MED FILL — Thrombin (Recombinant) For Soln 20000 Unit: CUTANEOUS | Qty: 1 | Status: AC

## 2018-02-14 NOTE — Progress Notes (Signed)
Subjective: 1 Day Post-Op Procedure(s) (LRB): Microlumbar decompression Lumbar Three-Lumbar Four, Lumbar Four-Lumbar Five, Lumbar Five-Sacral One (N/A) Patient reports pain as moderate. No leg pain, numbness, tingling. Reports incisional back pain. Foley removed about an hour ago has not voided yet. No CP, SOB, N/V. No other c/o. Asking about going home today.  Objective: Vital signs in last 24 hours: Temp:  [97.3 F (36.3 C)-98.6 F (37 C)] 98.3 F (36.8 C) (09/20 0500) Pulse Rate:  [47-69] 69 (09/20 0500) Resp:  [12-17] 13 (09/20 0500) BP: (119-152)/(69-86) 119/69 (09/20 0500) SpO2:  [91 %-99 %] 96 % (09/20 0500)  Intake/Output from previous day: 09/19 0701 - 09/20 0700 In: 2480 [P.O.:630; I.V.:1700; IV Piggyback:100] Out: 2750 [Urine:2150; Drains:150; Blood:450] Intake/Output this shift: No intake/output data recorded.  Recent Labs    02/14/18 0406  HGB 11.4*   Recent Labs    02/14/18 0406  WBC 11.1*  RBC 4.08  HCT 35.8*  PLT 196   Recent Labs    02/14/18 0406  NA 139  K 4.5  CL 107  CO2 22  BUN 18  CREATININE 1.02*  GLUCOSE 146*  CALCIUM 8.6*   No results for input(s): LABPT, INR in the last 72 hours.  Neurologically intact ABD soft Neurovascular intact Sensation intact distally Intact pulses distally Dorsiflexion/Plantar flexion intact Incision: dressing C/D/I and no drainage No cellulitis present Compartment soft no calf pain or sign of DVT  Assessment/Plan: 1 Day Post-Op Procedure(s) (LRB): Microlumbar decompression Lumbar Three-Lumbar Four, Lumbar Four-Lumbar Five, Lumbar Five-Sacral One (N/A) Advance diet Up with therapy D/C IV fluids Will D/C drain this AM and change dressing Possible D/C later today as long as voiding without difficulty and pain well controlled, does well with PT If unable to void in 6 hrs from foley removal, follow protocol- bladder scan, straight cath prn. Will discuss with Dr. Elissa LovettBeane   Dayle Mcnerney, Dayna BarkerJACLYN  M. 02/14/2018, 7:56 AM

## 2018-02-14 NOTE — Evaluation (Signed)
Occupational Therapy Evaluation and Discharge from OT Patient Details Name: Heidi Brown MRN: [id number] DOB: [birth year] Today's Date: 02/14/2018    History of Present Illness Pt is a 62 y/o female L3-S1 microlumbar decompression. PMH includes HTN.    Clinical Impression   PTA Pt independent in ADL and mobility (although been dealing with pain and limitations since 2018 when she initially fell and hurt her back per Pt). Pt is currently mod A for LB ADL to maintain back precautions, min guard/supervision for transfers with RW. Back handout provided and reviewed adls in detail. Pt educated on: avoid sitting for long periods of time, correct bed positioning for sleeping, correct sequence for bed mobility, avoiding lifting more than 5 pounds, AE (focus on toilet aide and long handle sponge), and never wash directly over incision. All education is complete and patient indicates understanding. Pt provided with toilet aide and long handle sponge. Pt will require 3 in 1 to act as toilet riser and shower chair for transfers and safety. Education complete. Thank you for the opportunity to serve this patient.     Follow Up Recommendations  Supervision/Assistance - 24 hour;No OT follow up(initially)    Equipment Recommendations  3 in 1 bedside commode;Other (comment)(provided with long handle sponge and toilet aide)    Recommendations for Other Services       Precautions / Restrictions Precautions Precautions: Back Precaution Booklet Issued: Yes (comment) Precaution Comments: Reviewed back precautions with pt.  Restrictions Weight Bearing Restrictions: No      Mobility Bed Mobility Overal bed mobility: Needs Assistance Bed Mobility: Rolling;Sit to Sidelying Rolling: Min guard       Sit to sidelying: Min guard General bed mobility comments: use of bed rail to assist with transfer, vc for sequencing with log roll  Transfers Overall transfer level: Needs assistance Equipment used:  Rolling walker (2 wheeled) Transfers: Sit to/from Stand Sit to Stand: Min guard         General transfer comment: Cues for hand placement to and from seated surface.  Pt initially reaching for RW to pull into standing.      Balance Overall balance assessment: Needs assistance Sitting-balance support: No upper extremity supported;Feet supported Sitting balance-Leahy Scale: Fair     Standing balance support: Bilateral upper extremity supported;During functional activity Standing balance-Leahy Scale: Poor Standing balance comment: Reliant on BUE support.                            ADL either performed or assessed with clinical judgement   ADL Overall ADL's : Needs assistance/impaired Eating/Feeding: Modified independent   Grooming: Wash/dry hands;Wash/dry face;Oral care;Supervision/safety;Standing;Cueing for compensatory techniques Grooming Details (indicate cue type and reason): educated on back precautions compensatory strategies at sink level -cup method, setting up on right etc Upper Body Bathing: Supervision/ safety;Sitting;With adaptive equipment Upper Body Bathing Details (indicate cue type and reason): provided with long handle sponge Lower Body Bathing: Supervison/ safety;With adaptive equipment;Sit to/from stand Lower Body Bathing Details (indicate cue type and reason): provided with long handle sponge Upper Body Dressing : Minimal assistance;Sitting   Lower Body Dressing: Moderate assistance;With caregiver independent assisting;Sit to/from stand Lower Body Dressing Details (indicate cue type and reason): educated in bringing feet cross to knees - reports family will assist her Toilet Transfer: Supervision/safety;Ambulation;RW   Toileting- ArchitectClothing Manipulation and Hygiene: Min guard;With adaptive equipment;Sit to/from stand Toileting - Clothing Manipulation Details (indicate cue type and reason): edcuated and provided with tongs for  rear peri care Tub/ Shower  Transfer: Walk-in shower;Supervision/safety;Ambulation;Rolling walker;Shower seat   Functional mobility during ADLs: Supervision/safety;Rolling walker(increased time due to pain)       Vision   Vision Assessment?: No apparent visual deficits     Perception     Praxis      Pertinent Vitals/Pain Pain Assessment: 0-10 Pain Score: 7  Pain Location: back  Pain Descriptors / Indicators: Aching;Operative site guarding Pain Intervention(s): Monitored during session;Repositioned     Hand Dominance Right   Extremity/Trunk Assessment Upper Extremity Assessment Upper Extremity Assessment: Overall WFL for tasks assessed   Lower Extremity Assessment Lower Extremity Assessment: Defer to PT evaluation   Cervical / Trunk Assessment Cervical / Trunk Assessment: Other exceptions Cervical / Trunk Exceptions: s/p lumbar surgery    Communication Communication Communication: No difficulties   Cognition Arousal/Alertness: Awake/alert Behavior During Therapy: WFL for tasks assessed/performed Overall Cognitive Status: Within Functional Limits for tasks assessed                                     General Comments       Exercises     Shoulder Instructions      Home Living Family/patient expects to be discharged to:: Private residence Living Arrangements: Spouse/significant other;Children;Other relatives Available Help at Discharge: Family;Available 24 hours/day Type of Home: House Home Access: Stairs to enter [employer] of Steps: 3 Entrance Stairs-Rails: None Home Layout: Two level Alternate Level Stairs-Number of Steps: 14 Alternate Level Stairs-Rails: Right;Left Bathroom Shower/Tub: Walk-in shower;Tub/shower Arts development officer: Yes How Accessible: Accessible via walker Home Equipment: Shower seat - built in          Prior Functioning/Environment Level of Independence: Independent        Comments: Still  working, driving, etc         OT Problem List: Decreased activity tolerance;Impaired balance (sitting and/or standing);Decreased knowledge of use of DME or AE;Decreased knowledge of precautions;Obesity;Pain      OT Treatment/Interventions:      OT Goals(Current goals can be found in the care plan section) Acute Rehab OT Goals Patient Stated Goal:  To go home tomorrow becuase her husband will not be back from out of town until 12 pm tonight.   OT Goal Formulation: With patient Time For Goal Achievement: 02/28/18 Potential to Achieve Goals: Good  OT Frequency:     Barriers to D/C:            Co-evaluation              AM-PAC PT "6 Clicks" Daily Activity     Outcome Measure Help from another person eating meals?: None Help from another person taking care of personal grooming?: A Little Help from another person toileting, which includes using toliet, bedpan, or urinal?: A Little Help from another person bathing (including washing, rinsing, drying)?: A Little Help from another person to put on and taking off regular upper body clothing?: A Little Help from another person to put on and taking off regular lower body clothing?: A Lot 6 Click Score: 18   End of Session Equipment Utilized During Treatment: Rolling walker Nurse Communication: Mobility status;Precautions  Activity Tolerance: Patient tolerated treatment well Patient left: in bed;with call bell/phone within reach  OT Visit Diagnosis: Unsteadiness on feet (R26.81);Other abnormalities of gait and mobility (R26.89);Pain Pain - part of body: (back)  Time: 0981-1914 OT Time Calculation (min): 26 min Charges:  OT General Charges $OT Visit: 1 Visit OT Evaluation $OT Eval Moderate Complexity: 1 Mod OT Treatments $Self Care/Home Management : 8-22 mins  Sherryl Manges OTR/L Acute Rehabilitation Services Pager: [phone] Office: [phone]  Evern Bio Shea Swalley 02/14/2018, 3:08 PM

## 2018-02-14 NOTE — Progress Notes (Addendum)
Physical Therapy Treatment Patient Details Name: Heidi Brown MRN: [id number] DOB: 1955-08-02 Today's Date: 02/14/2018    History of Present Illness Pt is a 62 y/o female L3-S1 microlumbar decompression. PMH includes HTN.     PT Comments    Pt performed gait training and progression to stair training this afternoon to prepare for return home.  Pt expressed concern about discharging today as her husband will not be back from out of town until midnight this evening.  Pt re-educated on spinal precautions as she was unable to recall during session.  Pt will require a youth height RW device for home use as well as a bedside commode.  Plan for retrial of stair training in am.    Follow Up Recommendations  No PT follow up;Supervision for mobility/OOB     Equipment Recommendations  Rolling walker with 5" wheels;3in1 (PT)(youth height.  )    Recommendations for Other Services       Precautions / Restrictions Precautions Precautions: Back Precaution Booklet Issued: Yes (comment) Precaution Comments: Reviewed back precautions with pt.  Restrictions Weight Bearing Restrictions: No    Mobility  Bed Mobility               General bed mobility comments: Pt seated in recliner on arrival.    Transfers Overall transfer level: Needs assistance Equipment used: Rolling walker (2 wheeled) Transfers: Sit to/from Stand Sit to Stand: Min guard         General transfer comment: Cues for hand placement to and from seated surface.  Pt initially reaching for RW to pull into standing.    Ambulation/Gait Ambulation/Gait assistance: Min guard;Supervision Gait Distance (Feet): 250 Feet Assistive device: Rolling walker (2 wheeled) Gait Pattern/deviations: Step-through pattern;Decreased stride length;Trunk flexed Gait velocity: Decreased    General Gait Details: Cues for upper trunk control, increasing stride length and maintaining close proximity to device.  Pt has a tendency to step out  of device to turn requiring cues to kep device closer to her person.     Stairs Stairs: Yes Stairs assistance: Min guard;Min assist Stair Management: No rails;One rail Right Number of Stairs: 6(x3 with R rail and x3 without rails and use of RW.  ) General stair comments: Cues for sequencing and RW placement for trial with out rails.  MIn assistance povided to stabilize RW duriing negotiation.  Forward to descend and backwards to ascend.  Cues for sequencing and RW placement.  Handout issued for carryover.  During 2nd trial patient performed with use of R rail to simulate negotiating flight to her bedroom.  Cues for sequencing and hand placement on railing.  Pt slightly guarded and educated using on cane in her left hand can help with her feeling of stability.     Wheelchair Mobility    Modified Rankin (Stroke Patients Only)       Balance Overall balance assessment: Needs assistance   Sitting balance-Leahy Scale: Fair       Standing balance-Leahy Scale: Poor Standing balance comment: Reliant on BUE support.                             Cognition Arousal/Alertness: Awake/alert Behavior During Therapy: WFL for tasks assessed/performed Overall Cognitive Status: Within Functional Limits for tasks assessed  Exercises      General Comments        Pertinent Vitals/Pain Pain Assessment: 0-10 Pain Location: back  Pain Descriptors / Indicators: Aching;Operative site guarding Pain Intervention(s): Monitored during session;Repositioned    Home Living                      Prior Function            PT Goals (current goals can now be found in the care plan section) Acute Rehab PT Goals Patient Stated Goal:  To go home tomorrow becuase her husband will not be back from out of town until 12 pm tonight.   Potential to Achieve Goals: Good Progress towards PT goals: Progressing toward goals     Frequency    Min 5X/week      PT Plan Discharge plan needs to be updated    Co-evaluation              AM-PAC PT "6 Clicks" Daily Activity  Outcome Measure  Difficulty turning over in bed (including adjusting bedclothes, sheets and blankets)?: Unable Difficulty moving from lying on back to sitting on the side of the bed? : Unable Difficulty sitting down on and standing up from a chair with arms (e.g., wheelchair, bedside commode, etc,.)?: Unable Help needed moving to and from a bed to chair (including a wheelchair)?: A Little Help needed walking in hospital room?: A Little Help needed climbing 3-5 steps with a railing? : A Little 6 Click Score: 12    End of Session Equipment Utilized During Treatment: Gait belt Activity Tolerance: Patient limited by pain Patient left: with call bell/phone within reach;with family/visitor present;in chair Nurse Communication: Mobility status;Other (comment)(educated NT to ambulate again today.  ) PT Visit Diagnosis: Other abnormalities of gait and mobility (R26.89);Pain Pain - part of body: (back)     Time: 4098-1191 PT Time Calculation (min) (ACUTE ONLY): 25 min  Charges:  $Gait Training: 8-22 mins $Therapeutic Activity: 8-22 mins                     [name], PTA Acute Rehabilitation Services Pager [phone] Office [phone]   [name] Artis Delay 02/14/2018, 1:41 PM

## 2018-02-14 NOTE — Care Management Note (Signed)
Case Management Note Original Note by: Elliot CousinShavis, Alesia Ellen, RN 02/13/2018, 4:25 PM  Patient Details  Name: Heidi BakerSharon O Flinchbaugh MRN: [id number] Date of Birth: 01/06/1956  Subjective/Objective:     microlumbar decompression of L3-L4, L4-5, L5-S1               Action/Plan: NCM spoke to pt and states her Worker's Comp is handled through her attorney, [employer] [place], # [phone]. States no DME is needed.    Expected Discharge Date:                 Expected Discharge Plan:  Home/Self Care  In-House Referral:  NA  Discharge planning Services  CM Consult  Post Acute Care Choice:    Choice offered to:  NA  DME Arranged:  3-N-1, Walker youth DME Agency:  [facility]  HH Arranged:  NA HH Agency:  NA  Status of Service:  Completed, signed off  If discussed at Long Length of Stay Meetings, dates discussed:    Additional Comments:  5pm 02/14/18 J. Kindrick Lankford, RN, BSn [facility] Case Manager called back; will fax DME orders to her to get approval from her adjustor for payment.  Unfortunately, [facility] DME rep gone for the day; unable to obtain needed equipment today, but can get in AM.  Weekend case manager [facility] regarding [facility].     02/14/18 J. Zyen Triggs, RN, BSN PT/OT evaluations completed today; recommending no OP follow up, DME for home.  Case listed as WC; patient states WC case manager phone is [phone].  Left message for Kennyth ArnoldStacy (name of person on voicemail) for possible coordination of DME.  [facility]waiting callback [facility].  Will leave handoff for WE case manager, should pt discharge on Saturday.    Quintella BatonJulie W. Alizon Schmeling, RN, BSN  Trauma/Neuro ICU Case Manager [phone]

## 2018-02-14 NOTE — Care Management Note (Signed)
Case Management Note Original Note by: Elliot CousinShavis, Alesia Ellen, RN 02/13/2018, 4:25 PM  Patient Details  Name: Heidi Brown MRN: [id number] Date of Birth: 10-16-1955  Subjective/Objective:     microlumbar decompression of L3-L4, L4-5, L5-S1               Action/Plan: NCM spoke to pt and states her Worker's Comp is handled through her attorney, [employer] [place], # [phone]. States no DME is needed.    Expected Discharge Date:                 Expected Discharge Plan:  Home/Self Care  In-House Referral:  NA  Discharge planning Services  CM Consult  Post Acute Care Choice:    Choice offered to:  NA  DME Arranged:  N/A DME Agency:  NA  HH Arranged:  NA HH Agency:  NA  Status of Service:  In process, will continue to follow  If discussed at Long Length of Stay Meetings, dates discussed:    Additional Comments:  02/14/18 J. Caige Almeda, RN, BSN PT/OT evaluations completed today; recommending no OP follow up, DME for home.  Case listed as WC; patient states WC case manager phone is [phone].  Left message for Kennyth ArnoldStacy (name of person on voicemail) for possible coordination of DME.  Need orders for DME (3 in 1, youth RW); [facility].  Will leave handoff for WE case manager, should pt discharge on Saturday.   Quintella BatonJulie W. Mekenna Finau, RN, BSN  Trauma/Neuro ICU Case Manager [phone]

## 2018-02-15 DIAGNOSIS — M48062 Spinal stenosis, lumbar region with neurogenic claudication: Secondary | ICD-10-CM | POA: Diagnosis not present

## 2018-02-15 MED ORDER — SCOPOLAMINE 1 MG/3DAYS TD PT72
1.0000 | MEDICATED_PATCH | TRANSDERMAL | Status: DC
  Administered 2018-02-15: 1.5 mg via TRANSDERMAL
  Filled 2018-02-15: qty 1

## 2018-02-15 NOTE — Care Management (Signed)
Pt ready for d/c today.  [facility] will deliver 3n1 and youth RW to room prior to d/c.

## 2018-02-15 NOTE — Progress Notes (Addendum)
LM with answering service for Dr on call paged due to pt havig itiching all extremities no rash swelling SHOB or wheezing.  Nausea with ambulation.  Need order for itching and possible Scopolamine patch

## 2018-02-15 NOTE — Progress Notes (Signed)
[facility] RX and d/c instructions given and explained with understanding husband and patient.  Pain meds given po for d/c VSS breathing regular and unlabored.

## 2018-02-15 NOTE — Progress Notes (Signed)
Physical Therapy Treatment Patient Details Name: Heidi Brown MRN: [id number] DOB: 01/13/1956 Today's Date: 02/15/2018    History of Present Illness Pt is a 62 y/o female L3-S1 microlumbar decompression. PMH includes HTN.     PT Comments    Pt received in recliner agreeable to participation in therapy. Pt ambulated 300 feet with RW supervision. Pt declined need for repeat stair training session. She reports feeling confident with technique and verbalizes no further questions or concerns.    Follow Up Recommendations  No PT follow up;Supervision for mobility/OOB     Equipment Recommendations  Rolling walker with 5" wheels;3in1 (PT)(youth height)    Recommendations for Other Services       Precautions / Restrictions Precautions Precautions: Back Precaution Comments: reviewed 3/3 back precautions    Mobility  Bed Mobility               General bed mobility comments: Pt received in recliner.  Transfers Overall transfer level: Needs assistance Equipment used: Rolling walker (2 wheeled) Transfers: Sit to/from Stand Sit to Stand: Min guard         General transfer comment: increased time and effort  Ambulation/Gait Ambulation/Gait assistance: Supervision Gait Distance (Feet): 300 Feet Assistive device: Rolling walker (2 wheeled) Gait Pattern/deviations: Step-through pattern;Decreased stride length Gait velocity: Decreased  Gait velocity interpretation: <1.31 ft/sec, indicative of household ambulator General Gait Details: cues for posture   Stairs             Wheelchair Mobility    Modified Rankin (Stroke Patients Only)       Balance   Sitting-balance support: No upper extremity supported;Feet supported Sitting balance-Leahy Scale: Fair     Standing balance support: Bilateral upper extremity supported;During functional activity Standing balance-Leahy Scale: Poor Standing balance comment: Reliant on BUE support.                             Cognition Arousal/Alertness: Awake/alert Behavior During Therapy: WFL for tasks assessed/performed Overall Cognitive Status: Within Functional Limits for tasks assessed                                        Exercises      General Comments        Pertinent Vitals/Pain Pain Assessment: 0-10 Pain Score: 5  Pain Location: back  Pain Descriptors / Indicators: Guarding;Grimacing;Sore Pain Intervention(s): Monitored during session;Repositioned    Home Living                      Prior Function            PT Goals (current goals can now be found in the care plan section) Acute Rehab PT Goals Patient Stated Goal: home today PT Goal Formulation: With patient/family Time For Goal Achievement: 02/27/18 Potential to Achieve Goals: Good Progress towards PT goals: Progressing toward goals    Frequency    Min 5X/week      PT Plan Current plan remains appropriate    Co-evaluation              AM-PAC PT "6 Clicks" Daily Activity  Outcome Measure  Difficulty turning over in bed (including adjusting bedclothes, sheets and blankets)?: A Lot Difficulty moving from lying on back to sitting on the side of the bed? : Unable Difficulty sitting down on and standing up from  a chair with arms (e.g., wheelchair, bedside commode, etc,.)?: A Lot Help needed moving to and from a bed to chair (including a wheelchair)?: A Little Help needed walking in hospital room?: A Little Help needed climbing 3-5 steps with a railing? : A Little 6 Click Score: 14    End of Session Equipment Utilized During Treatment: Gait belt Activity Tolerance: Patient tolerated treatment well Patient left: in chair;with call bell/phone within reach Nurse Communication: Mobility status PT Visit Diagnosis: Other abnormalities of gait and mobility (R26.89);Pain     Time: [id number] PT Time Calculation (min) (ACUTE ONLY): 14 min  Charges:  $Gait Training: 8-22 mins                      Aida RaiderWendy Chinyere Galiano, [place]PT  Office # [phone] Pager [phone]    Ilda FoilGarrow, Harvey Matlack Rene 02/15/2018, 1:12 PM

## 2018-02-15 NOTE — Progress Notes (Signed)
   Subjective:  Patient reports pain as mild.  Up walking the halls this morning.  States her leg pain is improved somewhat but she has not been able to walk far enough to elicit her claudication.  Pain is well controlled.  She has been able to void without issue.  She is been passing flatus.  Objective:   VITALS:   Vitals:   02/14/18 1255 02/14/18 2031 02/15/18 0507 02/15/18 0937  BP: 120/72 (!) 146/76 130/81   Pulse: 83 (!) 54 (!) 51 76  Resp:      Temp: 99 F (37.2 C) 97.9 F (36.6 C) 98.2 F (36.8 C)   TempSrc: Oral Oral Oral   SpO2: 97% 94% 94%   Weight:      Height:        Neurologically intact -Midline back incision dressing is clean dry and intact. Neurovascular intact bilateral lower extremities.  Lab Results  Component Value Date   WBC 11.1 (H) 02/14/2018   HGB 11.4 (L) 02/14/2018   HCT 35.8 (L) 02/14/2018   MCV 87.7 02/14/2018   PLT 196 02/14/2018   BMET    Component Value Date/Time   NA 139 02/14/2018 0406   K 4.5 02/14/2018 0406   CL 107 02/14/2018 0406   CO2 22 02/14/2018 0406   GLUCOSE 146 (H) 02/14/2018 0406   BUN 18 02/14/2018 0406   CREATININE 1.02 (H) 02/14/2018 0406   CALCIUM 8.6 (L) 02/14/2018 0406   GFRNONAA 58 (L) 02/14/2018 0406   GFRAA >60 02/14/2018 0406     Assessment/Plan: 2 Days Post-Op   Principal Problem:   Spinal stenosis of lumbar region Active Problems:   Spinal stenosis at L4-L5 level   Up with therapy -Plan for discharge home later today with follow-up with Dr. Juliene PinaBeane   Jason Patrick Rogers 02/15/2018, 10:09 AM   Maryan RuedJason P Rogers, MD [phone]

## 2018-02-17 NOTE — Discharge Summary (Signed)
Physician Discharge Summary   Patient ID: Heidi Brown MRN: [id number] DOB/AGE: [birth year] 62 y.o.  Admit date: 02/13/2018 Discharge date: 02/17/2018  Primary Diagnosis:   Spinal stenosis  Admission Diagnoses:  Past Medical History:  Diagnosis Date  . Arthritis   . GERD (gastroesophageal reflux disease)   . Goiter   . History of kidney stones   . Hypertension   . Pneumonia    walking pneumonia  . PONV (postoperative nausea and vomiting)   . Pre-diabetes    Discharge Diagnoses:   Principal Problem:   Spinal stenosis of lumbar region Active Problems:   Spinal stenosis at L4-L5 level  Procedure:  Procedure(s) (LRB): Microlumbar decompression Lumbar Three-Lumbar Four, Lumbar Four-Lumbar Five, Lumbar Five-Sacral One (N/A)   Consults: None  HPI:  see H&P    Laboratory Data: Hospital Outpatient Visit on 02/06/2018  Component Date Value Ref Range Status  . Glucose-Capillary 02/06/2018 198* 70 - 99 mg/dL Final  . MRSA, PCR 02/06/2018 NEGATIVE  NEGATIVE Final  . Staphylococcus aureus 02/06/2018 POSITIVE* NEGATIVE Final   Comment: (NOTE) The Xpert SA Assay (FDA approved for NASAL specimens in patients 26 years of age and older), is one component of a comprehensive surveillance program. It is not intended to diagnose infection nor to guide or monitor treatment. Performed at [facility], [place] [address], [place], [place] [address]   . Sodium 02/06/2018 142  135 - 145 mmol/L Final  . Potassium 02/06/2018 3.7  3.5 - 5.1 mmol/L Final  . Chloride 02/06/2018 112* 98 - 111 mmol/L Final  . CO2 02/06/2018 20* 22 - 32 mmol/L Final  . Glucose, Bld 02/06/2018 191* 70 - 99 mg/dL Final  . BUN 02/06/2018 21  8 - 23 mg/dL Final  . Creatinine, Ser 02/06/2018 1.48* 0.44 - 1.00 mg/dL Final  . Calcium 02/06/2018 8.9  8.9 - 10.3 mg/dL Final  . GFR calc non Af Amer 02/06/2018 37* >60 mL/min Final  . GFR calc Af Amer 02/06/2018 43* >60 mL/min Final   Comment: (NOTE) The eGFR has  been calculated using the CKD EPI equation. This calculation has not been validated in all clinical situations. eGFR's persistently <60 mL/min signify possible Chronic Kidney Disease.   . Anion gap 02/06/2018 10  5 - 15 Final   Performed at [facility], [place] [address], [place], [place] [address]  . WBC 02/06/2018 8.5  4.0 - 10.5 K/uL Final  . RBC 02/06/2018 4.31  3.87 - 5.11 MIL/uL Final  . Hemoglobin 02/06/2018 12.2  12.0 - 15.0 g/dL Final  . HCT 02/06/2018 38.6  36.0 - 46.0 % Final  . MCV 02/06/2018 89.6  78.0 - 100.0 fL Final  . MCH 02/06/2018 28.3  26.0 - 34.0 pg Final  . MCHC 02/06/2018 31.6  30.0 - 36.0 g/dL Final  . RDW 02/06/2018 13.8  11.5 - 15.5 % Final  . Platelets 02/06/2018 189  150 - 400 K/uL Final   Performed at [facility], [place] [address], [place], [place] [address]   No results for input(s): HGB in the last 72 hours. No results for input(s): WBC, RBC, HCT, PLT in the last 72 hours. No results for input(s): NA, K, CL, CO2, BUN, CREATININE, GLUCOSE, CALCIUM in the last 72 hours. No results for input(s): LABPT, INR in the last 72 hours.  X-Rays:Dg Lumbar Spine 2-3 Views  Result Date: 02/07/2018 CLINICAL DATA:  Lumbar spine stenosis. Scheduled for lumbar spine surgery. EXAM: LUMBAR SPINE - 2-3 VIEW  COMPARISON:  MRI 01/29/2017 and CT from 12/24/2016 FINDINGS: Alignment of lumbar spine has not significantly changed. Evidence for mild retrolisthesis at L1-L2. Large bridging osteophytes throughout the lumbar spine. Chronic disc space disease and narrowing at L4-L5 and L5-S1. Sclerosis and degenerative changes at the right SI joint. 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 No significant change since the prior imaging. Electronically Signed   By: Markus Daft M.D.   On: 02/07/2018 08:19   Dg Lumbar Spine Complete  Result Date: 02/13/2018 CLINICAL DATA:  Lumbar surgery. EXAM: LUMBAR SPINE - COMPLETE 4+ VIEW COMPARISON:  02/06/2018.  FINDINGS: On the last image of the series metallic markers are noted posteriorly at the L4-L5 disc space level, the upper portion of L5, and at the S1 level. IMPRESSION: Postsurgical changes lumbar spine as above. Electronically Signed   By: Marcello Moores  Register   On: 02/13/2018 11:26    EKG: Orders placed or performed in visit on 02/06/18  . EKG 12-Lead     Hospital Course: Patient was admitted to [facility] and taken to the OR and underwent the above state procedure without complications.  Patient tolerated the procedure well and was later transferred to the recovery room and then to the orthopaedic floor for postoperative care.  They were given PO and IV analgesics for pain control following their surgery.  They were given 24 hours of postoperative antibiotics.   PT was consulted postop to assist with mobility and transfers.  The patient was allowed to be WBAT with therapy and was taught back precautions. Discharge planning was consulted to help with postop disposition and equipment needs.  Patient had a good night on the evening of surgery and started to get up OOB with therapy on day one.  Due to pain and some shakiness with PT she elected to stay a second night. Patient was seen in rounds daily and was ready to go home on day two post-op.  They were given discharge instructions and dressing directions.  They were instructed on when to follow up in the office with Dr. Tonita Cong.   Diet: Regular diet Activity:WBAT, LSpine precautions Follow-up:in 10-14 days Disposition - Home Discharged Condition: good   Discharge Instructions    Call MD / Call 911   Complete by:  As directed    If you experience chest pain or shortness of breath, CALL 911 and be transported to the hospital emergency room.  If you develope a fever above 101 F, pus (white drainage) or increased drainage or redness at the wound, or calf pain, call your surgeon's office.   Constipation Prevention   Complete by:  As  directed    Drink plenty of fluids.  Prune juice may be helpful.  You may use a stool softener, such as Colace (over the counter) 100 mg twice a day.  Use MiraLax (over the counter) for constipation as needed.   Diet - low sodium heart healthy   Complete by:  As directed    Increase activity slowly as tolerated   Complete by:  As directed      Allergies as of 02/15/2018      Reactions   Erythromycin Itching, Rash, Other (See Comments)   Hives, itchy   Azithromycin    UNSPECIFIED REACTION    Clindamycin    UNSPECIFIED REACTION       Medication List    TAKE these medications   acetaminophen 500 MG tablet Commonly known as:  TYLENOL Take 1,000 mg by  mouth every 6 (six) hours as needed (for pain.).   carvedilol 12.5 MG tablet Commonly known as:  COREG Take 25 mg by mouth 2 (two) times daily.   docusate sodium 100 MG capsule Commonly known as:  COLACE Take 1 capsule (100 mg total) by mouth 2 (two) times daily.   ibuprofen 200 MG tablet Commonly known as:  ADVIL,MOTRIN Take 3 tablets (600 mg total) by mouth every 8 (eight) hours as needed (for pain.). May resume 5 days post-op as needed What changed:  additional instructions   methocarbamol 500 MG tablet Commonly known as:  ROBAXIN Take 1 tablet (500 mg total) by mouth every 6 (six) hours as needed for muscle spasms.   oxyCODONE 5 MG immediate release tablet Commonly known as:  Oxy IR/ROXICODONE Take 1-2 tablets (5-10 mg total) by mouth every 4 (four) hours as needed.   polyethylene glycol packet Commonly known as:  MIRALAX / GLYCOLAX Take 17 g by mouth daily.      Follow-up Information    Susa Day, MD. Schedule an appointment as soon as possible for a visit in 2 week(s).   Specialty:  Orthopedic Surgery Contact information: [address] [place] [address] [phone]           Signed: Lacie Draft PA-C Orthopaedic Surgery 02/17/2018, 8:56 AM

## 2019-03-22 IMAGING — CR DG LUMBAR SPINE 2-3V
2 series · 2 of 2 positions shown · non-contrast
Comparison: MRI 01/29/2017 and CT from 12/24/2016

CLINICAL DATA: Lumbar spine stenosis. Scheduled for lumbar spine
surgery.

EXAM:
LUMBAR SPINE - 2-3 VIEW

[w lumbar spine ap]
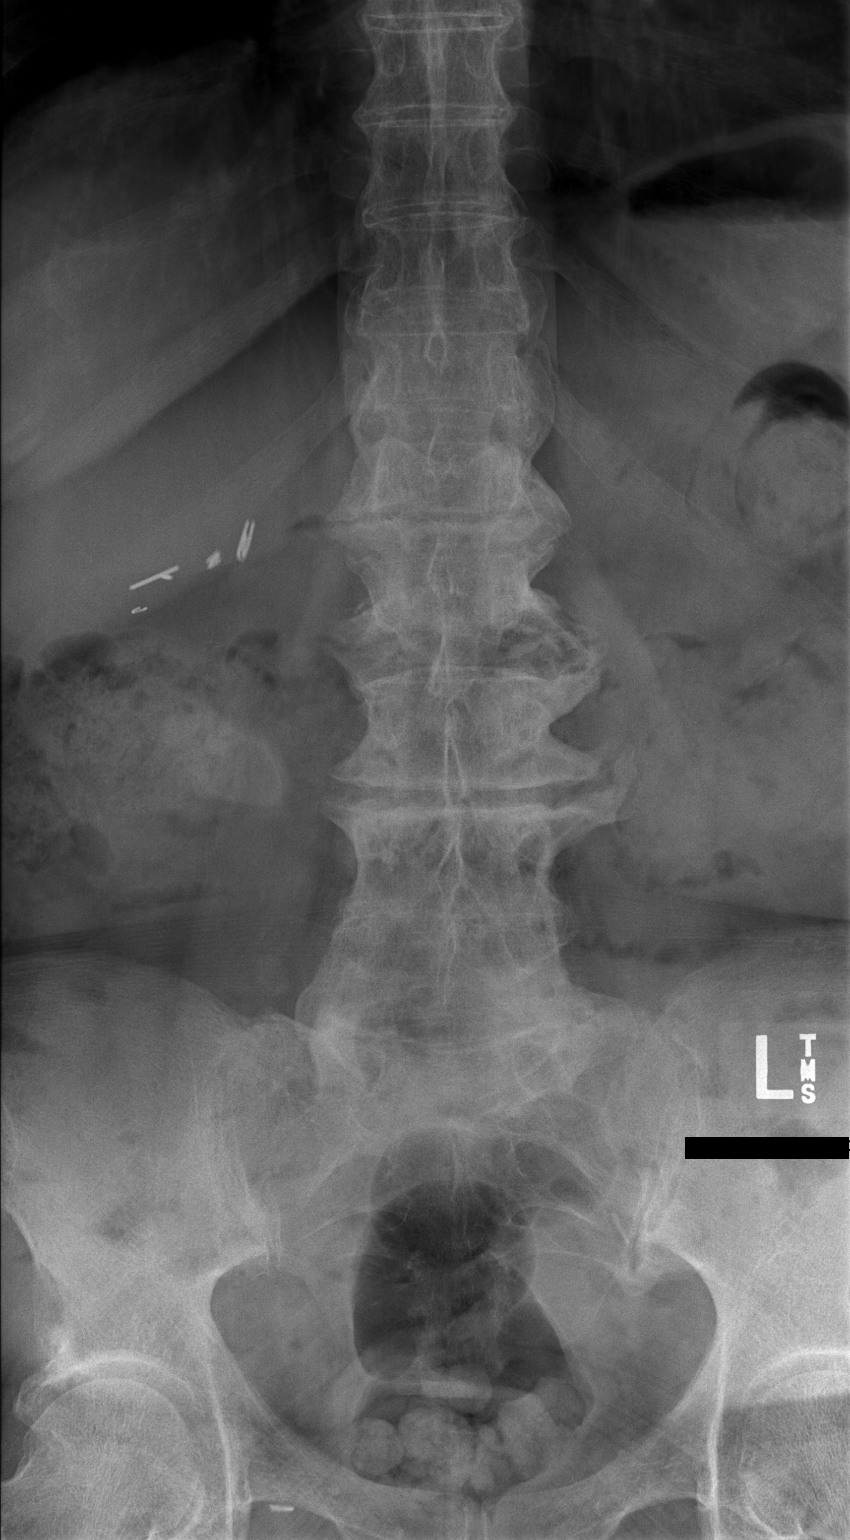

[w lumbar spine lat]
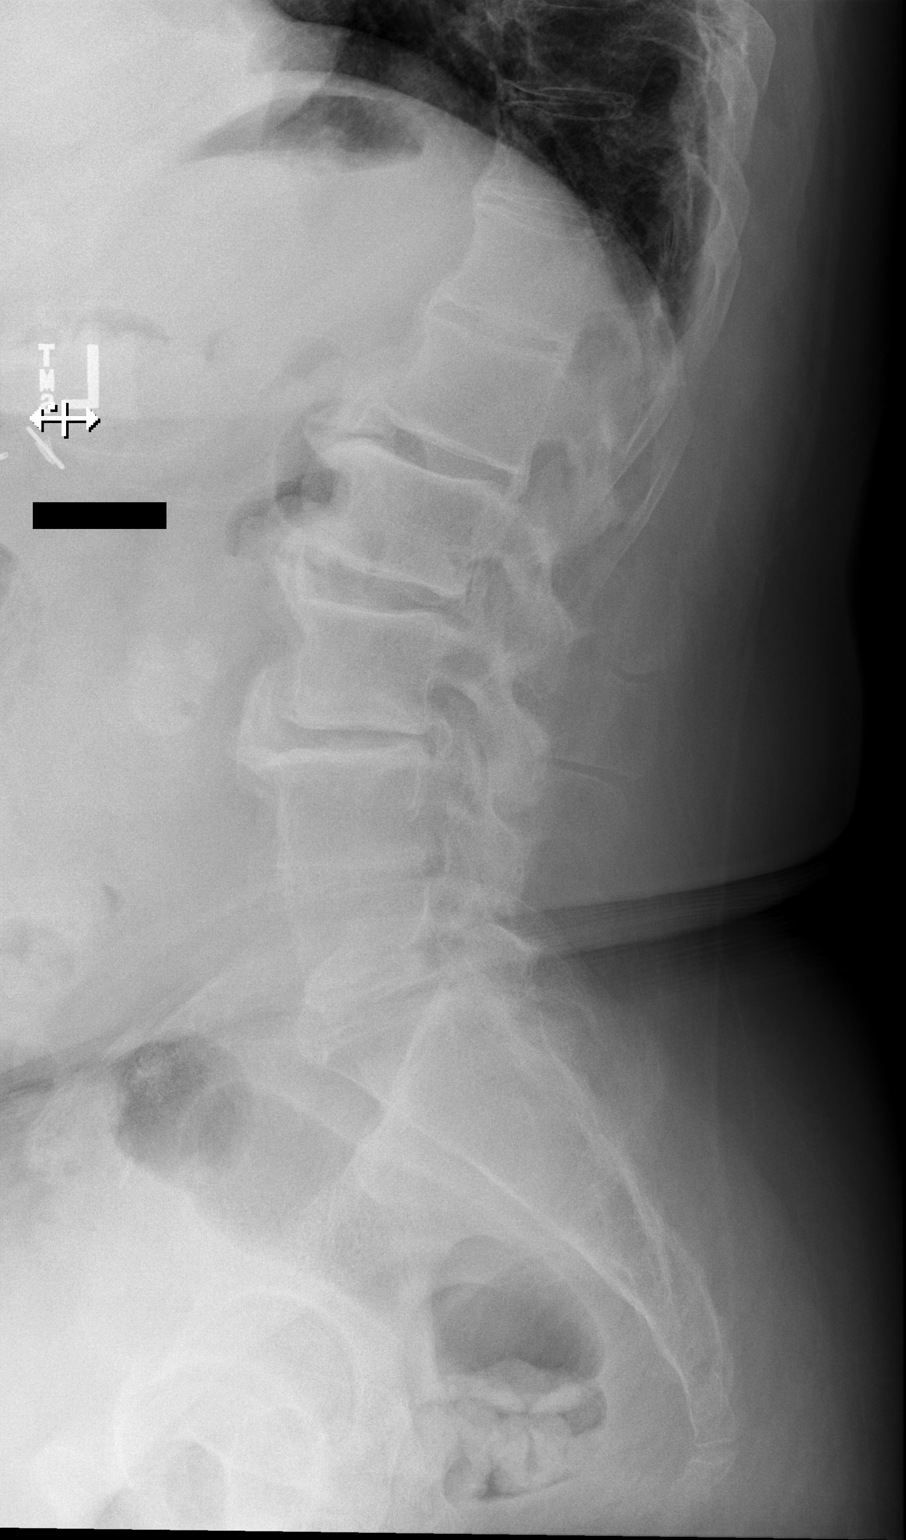

[2 of 2 positions shown; findings below may reference images not displayed]

FINDINGS: Alignment of lumbar spine has not significantly changed. Evidence
for mild retrolisthesis at L1-L2. Large bridging osteophytes
throughout the lumbar spine. Chronic disc space disease and
narrowing at L4-L5 and L5-S1. Sclerosis and degenerative changes at
the right SI joint.
IMPRESSION: Chronic multilevel degenerative disease in the lumbar spine. No
significant change since the prior imaging.

## 2019-03-29 IMAGING — CR DG LUMBAR SPINE COMPLETE 4+V
4 series · 4 of 4 positions shown · non-contrast
Comparison: 02/06/2018.

CLINICAL DATA: Lumbar surgery.

EXAM:
LUMBAR SPINE - COMPLETE 4+ VIEW

[AP]
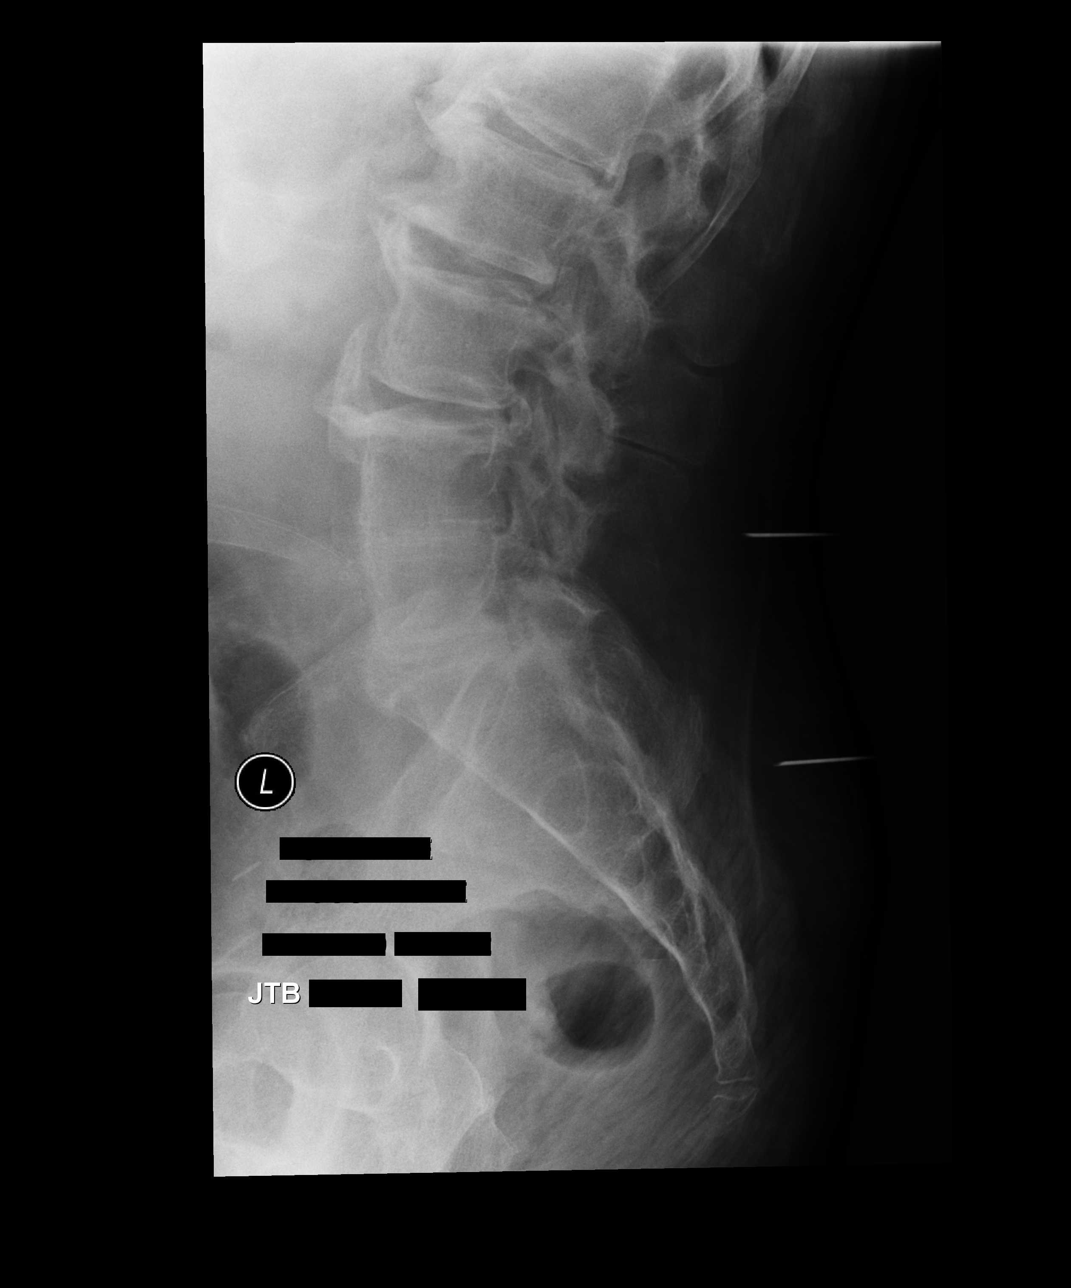

[lateral (1 of 3)]
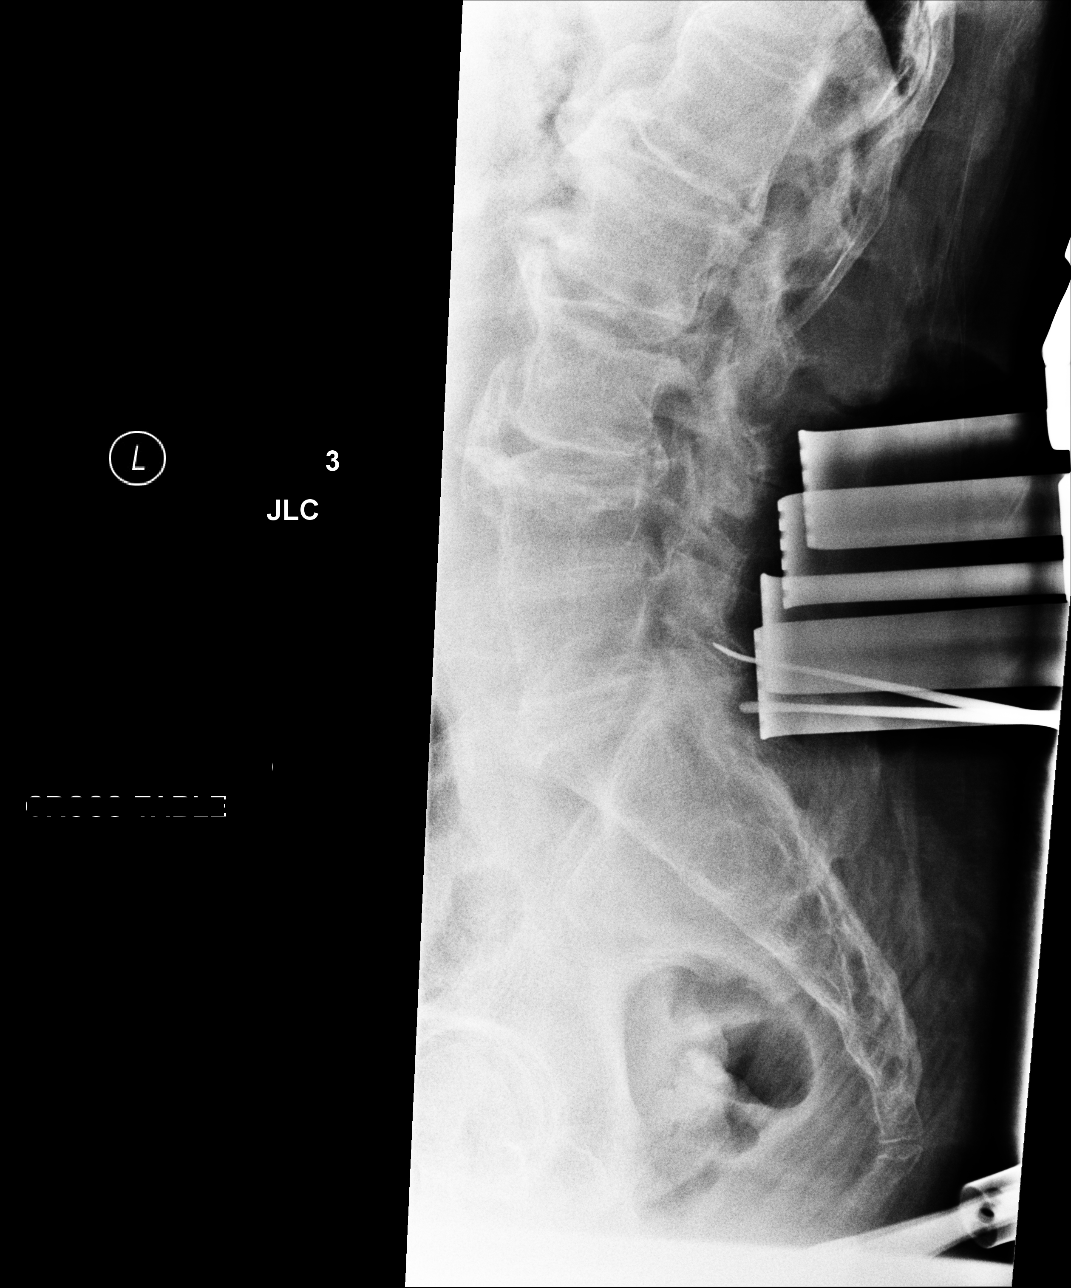

[lateral (2 of 3)]
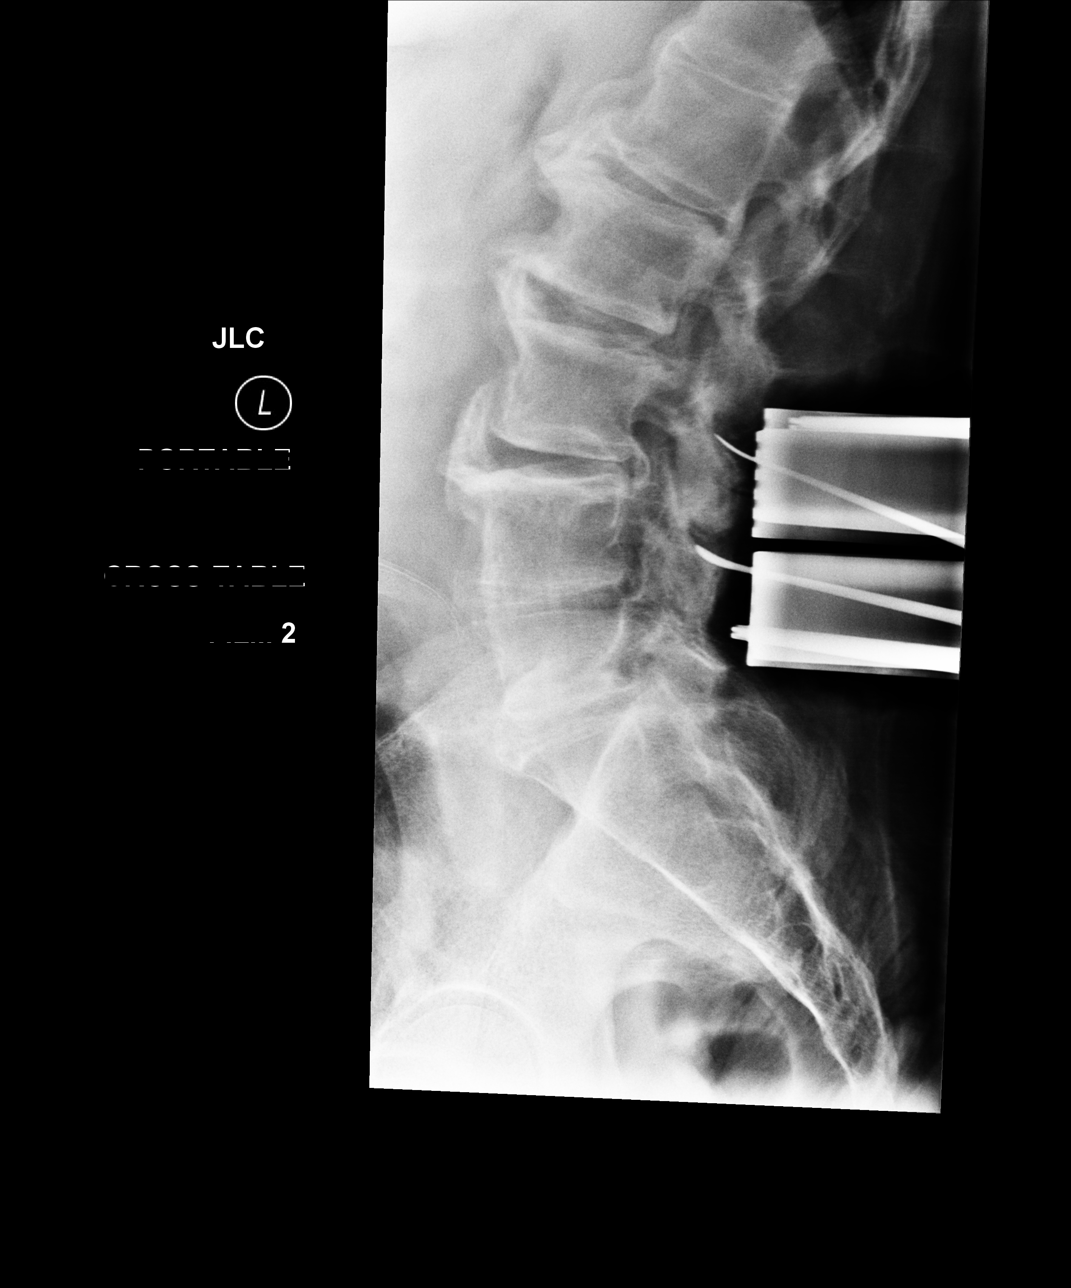

[lateral (3 of 3)]
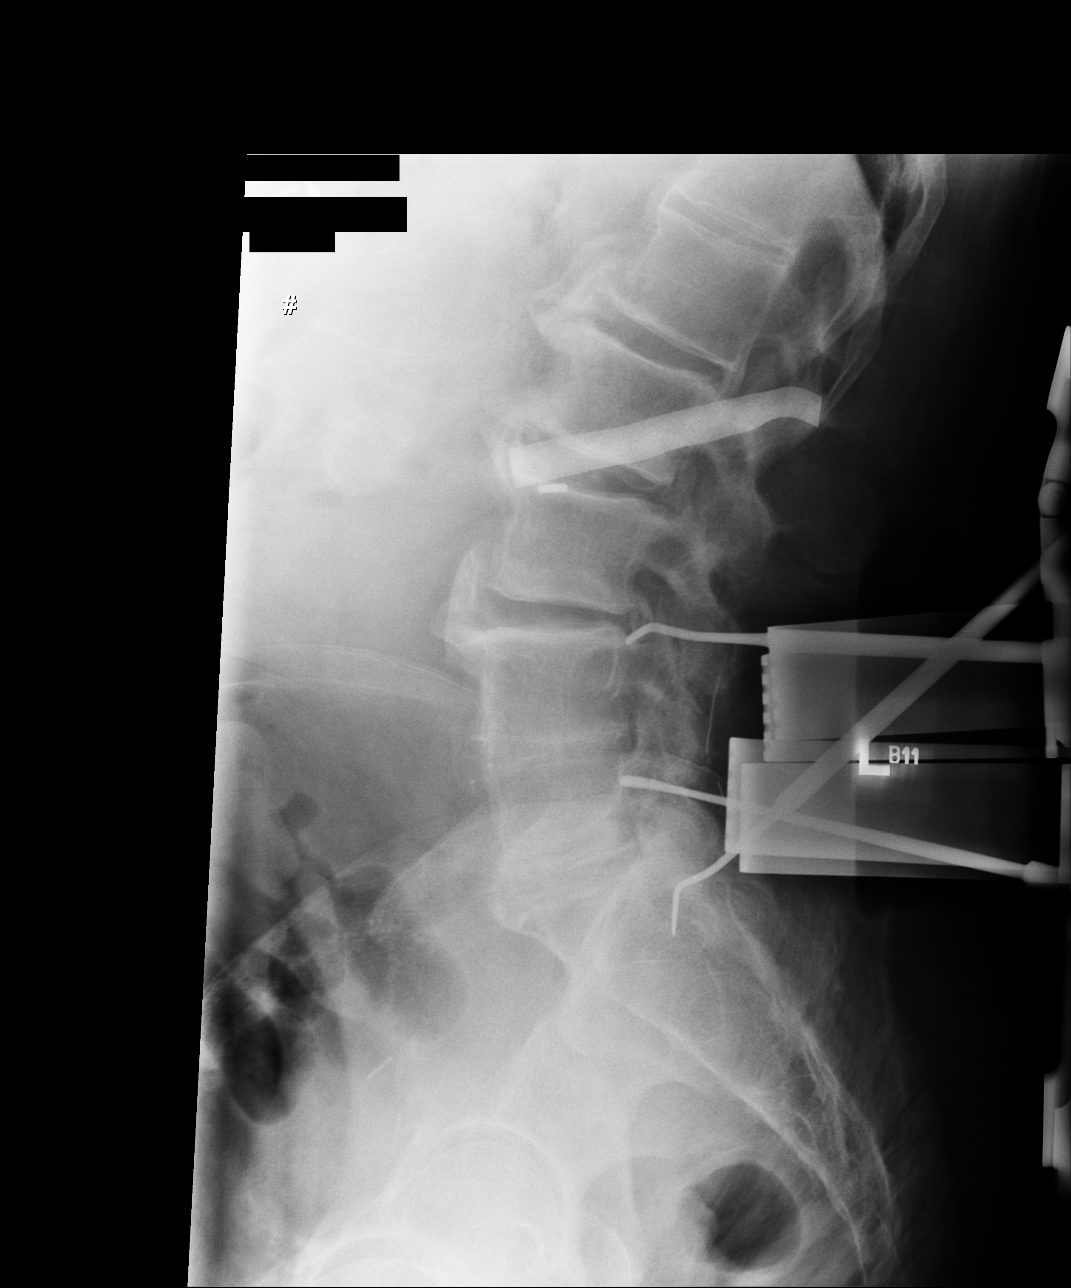

[4 of 4 positions shown; findings below may reference images not displayed]

FINDINGS: On the last image of the series metallic markers are noted
posteriorly at the L4-L5 disc space level, the upper portion of L5,
and at the S1 level.
IMPRESSION: Postsurgical changes lumbar spine as above.
